# Patient Record
Sex: Female | Born: 1965
Health system: Southern US, Community
[De-identification: ages and names within clinical notes are randomized; demographics above are authoritative.]

## PROBLEM LIST (undated history)

## (undated) DIAGNOSIS — G4730 Sleep apnea, unspecified: Secondary | ICD-10-CM

## (undated) DIAGNOSIS — I10 Essential (primary) hypertension: Secondary | ICD-10-CM

## (undated) DIAGNOSIS — K219 Gastro-esophageal reflux disease without esophagitis: Secondary | ICD-10-CM

## (undated) DIAGNOSIS — E039 Hypothyroidism, unspecified: Secondary | ICD-10-CM

## (undated) DIAGNOSIS — Z9109 Other allergy status, other than to drugs and biological substances: Secondary | ICD-10-CM

## (undated) DIAGNOSIS — T8859XA Other complications of anesthesia, initial encounter: Secondary | ICD-10-CM

## (undated) DIAGNOSIS — Z973 Presence of spectacles and contact lenses: Secondary | ICD-10-CM

## (undated) DIAGNOSIS — R519 Headache, unspecified: Secondary | ICD-10-CM

## (undated) DIAGNOSIS — R51 Headache: Secondary | ICD-10-CM

## (undated) DIAGNOSIS — J189 Pneumonia, unspecified organism: Secondary | ICD-10-CM

## (undated) DIAGNOSIS — T4145XA Adverse effect of unspecified anesthetic, initial encounter: Secondary | ICD-10-CM

## (undated) DIAGNOSIS — M4316 Spondylolisthesis, lumbar region: Secondary | ICD-10-CM

## (undated) DIAGNOSIS — E7800 Pure hypercholesterolemia, unspecified: Secondary | ICD-10-CM

## (undated) DIAGNOSIS — R001 Bradycardia, unspecified: Secondary | ICD-10-CM

## (undated) HISTORY — PX: BILATERAL CARPAL TUNNEL RELEASE: SHX6508

## (undated) HISTORY — PX: EYE SURGERY: SHX253

## (undated) HISTORY — PX: DILATION AND CURETTAGE OF UTERUS: SHX78

## (undated) HISTORY — PX: CERVICAL FUSION: SHX112

## (undated) HISTORY — PX: COLONOSCOPY: SHX174

---

## 2007-01-23 ENCOUNTER — Inpatient Hospital Stay (HOSPITAL_COMMUNITY): Admission: RE | Admit: 2007-01-23 | Discharge: 2007-01-24 | Payer: Self-pay | Admitting: Neurosurgery

## 2007-01-26 ENCOUNTER — Encounter: Admission: RE | Admit: 2007-01-26 | Discharge: 2007-01-26 | Payer: Self-pay | Admitting: Neurosurgery

## 2010-07-31 NOTE — Op Note (Signed)
NAMENOURA, PURPURA NO.:  [id number]   MEDICAL RECORD NO.:  [id number]          PATIENT TYPE:  INP   LOCATION:  2899                         FACILITY:  [facility]   PHYSICIAN:  Danae Orleans. Venetia Maxon, M.D.  DATE OF BIRTH:  [birth year]   DATE OF PROCEDURE:  01/23/2007  DATE OF DISCHARGE:                               OPERATIVE REPORT   PREOPERATIVE DIAGNOSIS:  Herniated cervical disk with spondylosis,  degenerative disk disease and radiculopathy, C5-6.   POSTOPERATIVE DIAGNOSIS:  Herniated cervical disk with spondylosis,  degenerative disk disease and radiculopathy, C5-6.   PROCEDURE:  Anterior cervical decompression and fusion C5-6 and with  allograft bone graft and morcellized bone autograft and anterior  cervical plate.   SURGEON:  Danae Orleans. Venetia Maxon, M.D.   ASSISTANTS:  1. Stefani Dama, M.D.  2. Georgiann Cocker, RN   ANESTHESIA:  General endotracheal anesthesia.   ESTIMATED BLOOD LOSS:  Minimal.   COMPLICATIONS:  None.   DISPOSITION:  To recovery.   INDICATIONS:  Audrey White is a 45 year old morbidly obese woman with  severe right arm pain and neck pain.  She has a large disk herniation  with spondylosis at C5-6 on the right.  It was elected to take her to  surgery for anterior cervical decompression and fusion for a fairly  marked preoperative arm weakness.   PROCEDURE:  Audrey White was brought to the operating room.  Following  satisfactory and uncomplicated induction of general endotracheal  anesthesia and placement of intravenous lines, the patient was placed in  a supine position on the operating table.  Her neck was placed in slight  extension.  She was placed in 5 pounds of Holter traction and her  anterior neck was then prepped and draped in the usual sterile fashion.  The area of plain incision was infiltrated with 0.25% Marcaine, 0.5%  lidocaine and 1:200,000 epinephrine.  The incision was made from the  midline to the anterior border of  sternocleidomastoid muscle on the left  side midline, carried sharply through platysmal layer.  Subplatysmal  dissection was performed exposing the anterior border of the  sternocleidomastoid muscle using blunt dissection.  The carotid sheath  was kept lateral and the trachea and esophagus kept medial, exposing the  anterior cervical spine.  The bent spinal needle was placed and was felt  to be at the C5-6 level, but on intraoperative x-ray it was not possible  to visualize this level.  Consequently, an additional needle was placed  at the C4-5 level and this level was visualized on the second x-ray.  Subsequently, the longus coli muscles were taken down from the anterior  cervical spine using electrocautery and key elevator and a self-  retaining retractor was placed to facilitate exposure.  Large ventral  osteophyte was removed with the Leksell rongeur and this bone graft  along with bone drilling of the end plates were saved for later use with  bone grafting.  Subsequently, distraction pins were placed at C5 and C6  and end plates were stripped of residual disk material and a thorough  diskectomy was performed.  Subsequently, a high-speed drill was used to  decorticate the end plates and uncinate spurs.  Microscope was brought  into the field and under microscopic visualization, the large uncinate  spur and significant foraminal stenosis was decompressing the right side  of the midline.  There were several pieces of free disk material which  were fairly far out into the foramen, which were removed, which resulted  in significant decompression of the C6 nerve root.  Spinal cord dura and  left C6 nerve root were also decompressed.  Hemostasis was assured with  Gelfoam soaked in thrombin.  After trial sizing, a 6 mm bone allograft  wedge was selected, fashioned with a high-speed drill, packed with  morcellized bone autograft and inserted into the interspace, countersunk  appropriately.   Traction weight was removed and a 14-mm Trestle anterior  cervical plate was then affixed to the anterior cervical spine using 14-  mm variable angle screws, two at C5, two at C6.  All screws had  excellent purchase.  Locking mechanisms were engaged.  A final x-ray was  not obtained because it was felt that it would not be possible to  visualize this level.  Soft tissues were inspected and found to be in  good repair.  The platysmal layer was closed with 3-0 Vicryl sutures.  Skin edges were reapproximated with 3-0 Vicryl interrupted, inverted  sutures.  The wound was dressed with Dermabond.  The patient was  extubated in the operating room and taken to the recovery room, having  tolerated the operation well.  Counts were correct at the end of the  case.      Danae Orleans. Venetia Maxon, M.D.  Electronically Signed     JDS/MEDQ  D:  01/23/2007  T:  01/23/2007  Job:  [id number]

## 2010-12-25 LAB — CBC
HCT: 41.8
MCV: 77.8 — ABNORMAL LOW
Platelets: 340
RDW: 15.8 — ABNORMAL HIGH
WBC: 14.8 — ABNORMAL HIGH

## 2010-12-25 LAB — BASIC METABOLIC PANEL
BUN: 9
CO2: 29
Creatinine, Ser: 0.87
Glucose, Bld: 94

## 2011-04-04 DIAGNOSIS — Z23 Encounter for immunization: Secondary | ICD-10-CM | POA: Diagnosis not present

## 2011-04-04 DIAGNOSIS — R0602 Shortness of breath: Secondary | ICD-10-CM | POA: Diagnosis not present

## 2011-04-04 DIAGNOSIS — E039 Hypothyroidism, unspecified: Secondary | ICD-10-CM | POA: Diagnosis not present

## 2011-04-04 DIAGNOSIS — M4800 Spinal stenosis, site unspecified: Secondary | ICD-10-CM | POA: Diagnosis not present

## 2011-04-04 DIAGNOSIS — R42 Dizziness and giddiness: Secondary | ICD-10-CM | POA: Diagnosis not present

## 2011-04-17 DIAGNOSIS — R0602 Shortness of breath: Secondary | ICD-10-CM | POA: Diagnosis not present

## 2011-04-17 DIAGNOSIS — I369 Nonrheumatic tricuspid valve disorder, unspecified: Secondary | ICD-10-CM | POA: Diagnosis not present

## 2011-04-20 DIAGNOSIS — M7710 Lateral epicondylitis, unspecified elbow: Secondary | ICD-10-CM | POA: Diagnosis not present

## 2011-04-20 DIAGNOSIS — G5600 Carpal tunnel syndrome, unspecified upper limb: Secondary | ICD-10-CM | POA: Diagnosis not present

## 2011-05-08 DIAGNOSIS — M4800 Spinal stenosis, site unspecified: Secondary | ICD-10-CM | POA: Diagnosis not present

## 2011-05-08 DIAGNOSIS — R05 Cough: Secondary | ICD-10-CM | POA: Diagnosis not present

## 2011-05-08 DIAGNOSIS — R059 Cough, unspecified: Secondary | ICD-10-CM | POA: Diagnosis not present

## 2011-05-08 DIAGNOSIS — H6120 Impacted cerumen, unspecified ear: Secondary | ICD-10-CM | POA: Diagnosis not present

## 2011-05-11 DIAGNOSIS — R05 Cough: Secondary | ICD-10-CM | POA: Diagnosis not present

## 2011-05-11 DIAGNOSIS — J309 Allergic rhinitis, unspecified: Secondary | ICD-10-CM | POA: Diagnosis not present

## 2011-05-11 DIAGNOSIS — R059 Cough, unspecified: Secondary | ICD-10-CM | POA: Diagnosis not present

## 2011-05-29 DIAGNOSIS — G5600 Carpal tunnel syndrome, unspecified upper limb: Secondary | ICD-10-CM | POA: Diagnosis not present

## 2011-05-29 DIAGNOSIS — M67919 Unspecified disorder of synovium and tendon, unspecified shoulder: Secondary | ICD-10-CM | POA: Diagnosis not present

## 2011-05-29 DIAGNOSIS — M719 Bursopathy, unspecified: Secondary | ICD-10-CM | POA: Diagnosis not present

## 2011-05-29 DIAGNOSIS — M47812 Spondylosis without myelopathy or radiculopathy, cervical region: Secondary | ICD-10-CM | POA: Diagnosis not present

## 2011-05-29 DIAGNOSIS — M542 Cervicalgia: Secondary | ICD-10-CM | POA: Diagnosis not present

## 2011-06-12 DIAGNOSIS — G5600 Carpal tunnel syndrome, unspecified upper limb: Secondary | ICD-10-CM | POA: Diagnosis not present

## 2011-06-12 DIAGNOSIS — M25819 Other specified joint disorders, unspecified shoulder: Secondary | ICD-10-CM | POA: Diagnosis not present

## 2011-06-17 DIAGNOSIS — M6281 Muscle weakness (generalized): Secondary | ICD-10-CM | POA: Diagnosis not present

## 2011-06-17 DIAGNOSIS — M24819 Other specific joint derangements of unspecified shoulder, not elsewhere classified: Secondary | ICD-10-CM | POA: Diagnosis not present

## 2011-06-26 DIAGNOSIS — M6281 Muscle weakness (generalized): Secondary | ICD-10-CM | POA: Diagnosis not present

## 2011-06-26 DIAGNOSIS — M24819 Other specific joint derangements of unspecified shoulder, not elsewhere classified: Secondary | ICD-10-CM | POA: Diagnosis not present

## 2011-06-28 DIAGNOSIS — M24819 Other specific joint derangements of unspecified shoulder, not elsewhere classified: Secondary | ICD-10-CM | POA: Diagnosis not present

## 2011-06-28 DIAGNOSIS — M6281 Muscle weakness (generalized): Secondary | ICD-10-CM | POA: Diagnosis not present

## 2011-07-01 DIAGNOSIS — M24819 Other specific joint derangements of unspecified shoulder, not elsewhere classified: Secondary | ICD-10-CM | POA: Diagnosis not present

## 2011-07-01 DIAGNOSIS — M6281 Muscle weakness (generalized): Secondary | ICD-10-CM | POA: Diagnosis not present

## 2011-07-05 DIAGNOSIS — M24819 Other specific joint derangements of unspecified shoulder, not elsewhere classified: Secondary | ICD-10-CM | POA: Diagnosis not present

## 2011-07-05 DIAGNOSIS — M6281 Muscle weakness (generalized): Secondary | ICD-10-CM | POA: Diagnosis not present

## 2011-07-08 DIAGNOSIS — M24819 Other specific joint derangements of unspecified shoulder, not elsewhere classified: Secondary | ICD-10-CM | POA: Diagnosis not present

## 2011-07-08 DIAGNOSIS — M6281 Muscle weakness (generalized): Secondary | ICD-10-CM | POA: Diagnosis not present

## 2011-07-10 DIAGNOSIS — G5600 Carpal tunnel syndrome, unspecified upper limb: Secondary | ICD-10-CM | POA: Diagnosis not present

## 2011-07-10 DIAGNOSIS — M25819 Other specified joint disorders, unspecified shoulder: Secondary | ICD-10-CM | POA: Diagnosis not present

## 2011-08-20 DIAGNOSIS — E7800 Pure hypercholesterolemia, unspecified: Secondary | ICD-10-CM | POA: Diagnosis not present

## 2011-08-20 DIAGNOSIS — G510 Bell's palsy: Secondary | ICD-10-CM | POA: Diagnosis not present

## 2011-08-20 DIAGNOSIS — R5383 Other fatigue: Secondary | ICD-10-CM | POA: Diagnosis not present

## 2011-08-20 DIAGNOSIS — R4789 Other speech disturbances: Secondary | ICD-10-CM | POA: Diagnosis not present

## 2011-08-20 DIAGNOSIS — R5381 Other malaise: Secondary | ICD-10-CM | POA: Diagnosis not present

## 2011-08-20 DIAGNOSIS — R209 Unspecified disturbances of skin sensation: Secondary | ICD-10-CM | POA: Diagnosis not present

## 2011-08-20 DIAGNOSIS — H538 Other visual disturbances: Secondary | ICD-10-CM | POA: Diagnosis not present

## 2011-08-20 DIAGNOSIS — I10 Essential (primary) hypertension: Secondary | ICD-10-CM | POA: Diagnosis not present

## 2011-09-17 DIAGNOSIS — F331 Major depressive disorder, recurrent, moderate: Secondary | ICD-10-CM | POA: Diagnosis not present

## 2011-09-17 DIAGNOSIS — E782 Mixed hyperlipidemia: Secondary | ICD-10-CM | POA: Diagnosis not present

## 2011-09-17 DIAGNOSIS — E039 Hypothyroidism, unspecified: Secondary | ICD-10-CM | POA: Diagnosis not present

## 2011-09-17 DIAGNOSIS — I10 Essential (primary) hypertension: Secondary | ICD-10-CM | POA: Diagnosis not present

## 2011-10-02 DIAGNOSIS — H04129 Dry eye syndrome of unspecified lacrimal gland: Secondary | ICD-10-CM | POA: Diagnosis not present

## 2011-10-02 DIAGNOSIS — G510 Bell's palsy: Secondary | ICD-10-CM | POA: Diagnosis not present

## 2011-12-14 DIAGNOSIS — L255 Unspecified contact dermatitis due to plants, except food: Secondary | ICD-10-CM | POA: Diagnosis not present

## 2012-01-06 DIAGNOSIS — J329 Chronic sinusitis, unspecified: Secondary | ICD-10-CM | POA: Diagnosis not present

## 2012-01-21 DIAGNOSIS — R42 Dizziness and giddiness: Secondary | ICD-10-CM | POA: Diagnosis not present

## 2012-01-21 DIAGNOSIS — E039 Hypothyroidism, unspecified: Secondary | ICD-10-CM | POA: Diagnosis not present

## 2012-01-21 DIAGNOSIS — Z79899 Other long term (current) drug therapy: Secondary | ICD-10-CM | POA: Diagnosis not present

## 2012-01-23 DIAGNOSIS — M6281 Muscle weakness (generalized): Secondary | ICD-10-CM | POA: Diagnosis not present

## 2012-01-23 DIAGNOSIS — R262 Difficulty in walking, not elsewhere classified: Secondary | ICD-10-CM | POA: Diagnosis not present

## 2012-01-30 DIAGNOSIS — R262 Difficulty in walking, not elsewhere classified: Secondary | ICD-10-CM | POA: Diagnosis not present

## 2012-01-30 DIAGNOSIS — M6281 Muscle weakness (generalized): Secondary | ICD-10-CM | POA: Diagnosis not present

## 2012-02-03 DIAGNOSIS — M6281 Muscle weakness (generalized): Secondary | ICD-10-CM | POA: Diagnosis not present

## 2012-02-03 DIAGNOSIS — R262 Difficulty in walking, not elsewhere classified: Secondary | ICD-10-CM | POA: Diagnosis not present

## 2012-02-07 DIAGNOSIS — M6281 Muscle weakness (generalized): Secondary | ICD-10-CM | POA: Diagnosis not present

## 2012-02-07 DIAGNOSIS — R262 Difficulty in walking, not elsewhere classified: Secondary | ICD-10-CM | POA: Diagnosis not present

## 2012-02-10 DIAGNOSIS — R262 Difficulty in walking, not elsewhere classified: Secondary | ICD-10-CM | POA: Diagnosis not present

## 2012-02-10 DIAGNOSIS — M6281 Muscle weakness (generalized): Secondary | ICD-10-CM | POA: Diagnosis not present

## 2012-02-17 DIAGNOSIS — R262 Difficulty in walking, not elsewhere classified: Secondary | ICD-10-CM | POA: Diagnosis not present

## 2012-02-17 DIAGNOSIS — M6281 Muscle weakness (generalized): Secondary | ICD-10-CM | POA: Diagnosis not present

## 2012-02-19 DIAGNOSIS — R269 Unspecified abnormalities of gait and mobility: Secondary | ICD-10-CM | POA: Diagnosis not present

## 2012-02-19 DIAGNOSIS — E785 Hyperlipidemia, unspecified: Secondary | ICD-10-CM | POA: Diagnosis not present

## 2012-02-19 DIAGNOSIS — E669 Obesity, unspecified: Secondary | ICD-10-CM | POA: Diagnosis not present

## 2012-02-20 DIAGNOSIS — M6281 Muscle weakness (generalized): Secondary | ICD-10-CM | POA: Diagnosis not present

## 2012-02-20 DIAGNOSIS — R262 Difficulty in walking, not elsewhere classified: Secondary | ICD-10-CM | POA: Diagnosis not present

## 2012-02-25 DIAGNOSIS — M6281 Muscle weakness (generalized): Secondary | ICD-10-CM | POA: Diagnosis not present

## 2012-02-25 DIAGNOSIS — R262 Difficulty in walking, not elsewhere classified: Secondary | ICD-10-CM | POA: Diagnosis not present

## 2012-02-28 DIAGNOSIS — R269 Unspecified abnormalities of gait and mobility: Secondary | ICD-10-CM | POA: Diagnosis not present

## 2012-03-03 DIAGNOSIS — R262 Difficulty in walking, not elsewhere classified: Secondary | ICD-10-CM | POA: Diagnosis not present

## 2012-03-03 DIAGNOSIS — M6281 Muscle weakness (generalized): Secondary | ICD-10-CM | POA: Diagnosis not present

## 2012-03-06 DIAGNOSIS — M6281 Muscle weakness (generalized): Secondary | ICD-10-CM | POA: Diagnosis not present

## 2012-03-06 DIAGNOSIS — R262 Difficulty in walking, not elsewhere classified: Secondary | ICD-10-CM | POA: Diagnosis not present

## 2012-03-09 DIAGNOSIS — M6281 Muscle weakness (generalized): Secondary | ICD-10-CM | POA: Diagnosis not present

## 2012-03-09 DIAGNOSIS — R262 Difficulty in walking, not elsewhere classified: Secondary | ICD-10-CM | POA: Diagnosis not present

## 2012-03-16 DIAGNOSIS — M6281 Muscle weakness (generalized): Secondary | ICD-10-CM | POA: Diagnosis not present

## 2012-03-16 DIAGNOSIS — R262 Difficulty in walking, not elsewhere classified: Secondary | ICD-10-CM | POA: Diagnosis not present

## 2012-03-19 DIAGNOSIS — R262 Difficulty in walking, not elsewhere classified: Secondary | ICD-10-CM | POA: Diagnosis not present

## 2012-03-19 DIAGNOSIS — M6281 Muscle weakness (generalized): Secondary | ICD-10-CM | POA: Diagnosis not present

## 2012-04-09 DIAGNOSIS — R269 Unspecified abnormalities of gait and mobility: Secondary | ICD-10-CM | POA: Diagnosis not present

## 2012-04-09 DIAGNOSIS — E785 Hyperlipidemia, unspecified: Secondary | ICD-10-CM | POA: Diagnosis not present

## 2012-04-09 DIAGNOSIS — E669 Obesity, unspecified: Secondary | ICD-10-CM | POA: Diagnosis not present

## 2012-04-23 DIAGNOSIS — D539 Nutritional anemia, unspecified: Secondary | ICD-10-CM | POA: Diagnosis not present

## 2012-04-23 DIAGNOSIS — H6120 Impacted cerumen, unspecified ear: Secondary | ICD-10-CM | POA: Diagnosis not present

## 2012-04-23 DIAGNOSIS — E039 Hypothyroidism, unspecified: Secondary | ICD-10-CM | POA: Diagnosis not present

## 2012-04-23 DIAGNOSIS — Z79899 Other long term (current) drug therapy: Secondary | ICD-10-CM | POA: Diagnosis not present

## 2012-06-01 DIAGNOSIS — D45 Polycythemia vera: Secondary | ICD-10-CM | POA: Diagnosis not present

## 2012-06-01 DIAGNOSIS — D72829 Elevated white blood cell count, unspecified: Secondary | ICD-10-CM | POA: Diagnosis not present

## 2012-06-25 DIAGNOSIS — N809 Endometriosis, unspecified: Secondary | ICD-10-CM | POA: Diagnosis not present

## 2012-07-10 DIAGNOSIS — D72829 Elevated white blood cell count, unspecified: Secondary | ICD-10-CM | POA: Diagnosis not present

## 2012-07-10 DIAGNOSIS — D45 Polycythemia vera: Secondary | ICD-10-CM | POA: Diagnosis not present

## 2012-08-06 DIAGNOSIS — N926 Irregular menstruation, unspecified: Secondary | ICD-10-CM | POA: Diagnosis not present

## 2012-08-17 DIAGNOSIS — N920 Excessive and frequent menstruation with regular cycle: Secondary | ICD-10-CM | POA: Diagnosis not present

## 2012-08-24 DIAGNOSIS — N926 Irregular menstruation, unspecified: Secondary | ICD-10-CM | POA: Diagnosis not present

## 2012-09-08 DIAGNOSIS — E039 Hypothyroidism, unspecified: Secondary | ICD-10-CM | POA: Diagnosis not present

## 2012-09-08 DIAGNOSIS — N809 Endometriosis, unspecified: Secondary | ICD-10-CM | POA: Diagnosis not present

## 2012-09-08 DIAGNOSIS — I10 Essential (primary) hypertension: Secondary | ICD-10-CM | POA: Diagnosis not present

## 2012-09-08 DIAGNOSIS — M542 Cervicalgia: Secondary | ICD-10-CM | POA: Diagnosis not present

## 2012-09-08 DIAGNOSIS — Z006 Encounter for examination for normal comparison and control in clinical research program: Secondary | ICD-10-CM | POA: Diagnosis not present

## 2012-09-17 DIAGNOSIS — N898 Other specified noninflammatory disorders of vagina: Secondary | ICD-10-CM | POA: Diagnosis not present

## 2012-09-17 DIAGNOSIS — N926 Irregular menstruation, unspecified: Secondary | ICD-10-CM | POA: Diagnosis not present

## 2012-09-29 DIAGNOSIS — Z1231 Encounter for screening mammogram for malignant neoplasm of breast: Secondary | ICD-10-CM | POA: Diagnosis not present

## 2012-10-08 DIAGNOSIS — H04129 Dry eye syndrome of unspecified lacrimal gland: Secondary | ICD-10-CM | POA: Diagnosis not present

## 2012-10-14 DIAGNOSIS — N949 Unspecified condition associated with female genital organs and menstrual cycle: Secondary | ICD-10-CM | POA: Diagnosis not present

## 2012-10-14 DIAGNOSIS — I10 Essential (primary) hypertension: Secondary | ICD-10-CM | POA: Diagnosis not present

## 2012-10-14 DIAGNOSIS — N920 Excessive and frequent menstruation with regular cycle: Secondary | ICD-10-CM | POA: Diagnosis not present

## 2012-10-14 DIAGNOSIS — E039 Hypothyroidism, unspecified: Secondary | ICD-10-CM | POA: Diagnosis not present

## 2012-10-14 DIAGNOSIS — N938 Other specified abnormal uterine and vaginal bleeding: Secondary | ICD-10-CM | POA: Diagnosis not present

## 2012-10-14 DIAGNOSIS — N926 Irregular menstruation, unspecified: Secondary | ICD-10-CM | POA: Diagnosis not present

## 2012-10-14 DIAGNOSIS — Z79899 Other long term (current) drug therapy: Secondary | ICD-10-CM | POA: Diagnosis not present

## 2012-10-14 DIAGNOSIS — K219 Gastro-esophageal reflux disease without esophagitis: Secondary | ICD-10-CM | POA: Diagnosis not present

## 2012-11-10 DIAGNOSIS — D72829 Elevated white blood cell count, unspecified: Secondary | ICD-10-CM | POA: Diagnosis not present

## 2012-11-10 DIAGNOSIS — D45 Polycythemia vera: Secondary | ICD-10-CM | POA: Diagnosis not present

## 2012-11-18 DIAGNOSIS — M25539 Pain in unspecified wrist: Secondary | ICD-10-CM | POA: Diagnosis not present

## 2012-11-18 DIAGNOSIS — M25549 Pain in joints of unspecified hand: Secondary | ICD-10-CM | POA: Diagnosis not present

## 2012-11-19 DIAGNOSIS — Z23 Encounter for immunization: Secondary | ICD-10-CM | POA: Diagnosis not present

## 2012-11-30 DIAGNOSIS — M25549 Pain in joints of unspecified hand: Secondary | ICD-10-CM | POA: Diagnosis not present

## 2012-11-30 DIAGNOSIS — M79609 Pain in unspecified limb: Secondary | ICD-10-CM | POA: Diagnosis not present

## 2012-11-30 DIAGNOSIS — M542 Cervicalgia: Secondary | ICD-10-CM | POA: Diagnosis not present

## 2013-01-06 DIAGNOSIS — N926 Irregular menstruation, unspecified: Secondary | ICD-10-CM | POA: Diagnosis not present

## 2013-01-06 DIAGNOSIS — N898 Other specified noninflammatory disorders of vagina: Secondary | ICD-10-CM | POA: Diagnosis not present

## 2013-01-06 DIAGNOSIS — R351 Nocturia: Secondary | ICD-10-CM | POA: Diagnosis not present

## 2013-04-09 DIAGNOSIS — E782 Mixed hyperlipidemia: Secondary | ICD-10-CM | POA: Diagnosis not present

## 2013-04-09 DIAGNOSIS — F331 Major depressive disorder, recurrent, moderate: Secondary | ICD-10-CM | POA: Diagnosis not present

## 2013-04-09 DIAGNOSIS — E559 Vitamin D deficiency, unspecified: Secondary | ICD-10-CM | POA: Diagnosis not present

## 2013-04-09 DIAGNOSIS — E039 Hypothyroidism, unspecified: Secondary | ICD-10-CM | POA: Diagnosis not present

## 2013-04-09 DIAGNOSIS — I10 Essential (primary) hypertension: Secondary | ICD-10-CM | POA: Diagnosis not present

## 2013-05-20 DIAGNOSIS — Z09 Encounter for follow-up examination after completed treatment for conditions other than malignant neoplasm: Secondary | ICD-10-CM | POA: Diagnosis not present

## 2013-05-20 DIAGNOSIS — D45 Polycythemia vera: Secondary | ICD-10-CM | POA: Diagnosis not present

## 2013-05-20 DIAGNOSIS — D72829 Elevated white blood cell count, unspecified: Secondary | ICD-10-CM | POA: Diagnosis not present

## 2013-06-21 DIAGNOSIS — J329 Chronic sinusitis, unspecified: Secondary | ICD-10-CM | POA: Diagnosis not present

## 2013-07-07 DIAGNOSIS — J40 Bronchitis, not specified as acute or chronic: Secondary | ICD-10-CM | POA: Diagnosis not present

## 2013-07-14 DIAGNOSIS — B9689 Other specified bacterial agents as the cause of diseases classified elsewhere: Secondary | ICD-10-CM | POA: Diagnosis not present

## 2013-07-14 DIAGNOSIS — N898 Other specified noninflammatory disorders of vagina: Secondary | ICD-10-CM | POA: Diagnosis not present

## 2013-07-14 DIAGNOSIS — N760 Acute vaginitis: Secondary | ICD-10-CM | POA: Diagnosis not present

## 2013-07-14 DIAGNOSIS — A499 Bacterial infection, unspecified: Secondary | ICD-10-CM | POA: Diagnosis not present

## 2013-07-22 DIAGNOSIS — R05 Cough: Secondary | ICD-10-CM | POA: Diagnosis not present

## 2013-07-22 DIAGNOSIS — R059 Cough, unspecified: Secondary | ICD-10-CM | POA: Diagnosis not present

## 2013-08-06 DIAGNOSIS — R059 Cough, unspecified: Secondary | ICD-10-CM | POA: Diagnosis not present

## 2013-08-06 DIAGNOSIS — R05 Cough: Secondary | ICD-10-CM | POA: Diagnosis not present

## 2013-08-11 DIAGNOSIS — G4730 Sleep apnea, unspecified: Secondary | ICD-10-CM | POA: Diagnosis not present

## 2013-08-11 DIAGNOSIS — R059 Cough, unspecified: Secondary | ICD-10-CM | POA: Diagnosis not present

## 2013-08-11 DIAGNOSIS — E559 Vitamin D deficiency, unspecified: Secondary | ICD-10-CM | POA: Diagnosis not present

## 2013-08-11 DIAGNOSIS — R5383 Other fatigue: Secondary | ICD-10-CM | POA: Diagnosis not present

## 2013-08-11 DIAGNOSIS — G4710 Hypersomnia, unspecified: Secondary | ICD-10-CM | POA: Diagnosis not present

## 2013-08-11 DIAGNOSIS — R5381 Other malaise: Secondary | ICD-10-CM | POA: Diagnosis not present

## 2013-08-11 DIAGNOSIS — R05 Cough: Secondary | ICD-10-CM | POA: Diagnosis not present

## 2013-08-18 DIAGNOSIS — R05 Cough: Secondary | ICD-10-CM | POA: Diagnosis not present

## 2013-08-18 DIAGNOSIS — R5381 Other malaise: Secondary | ICD-10-CM | POA: Diagnosis not present

## 2013-08-18 DIAGNOSIS — R059 Cough, unspecified: Secondary | ICD-10-CM | POA: Diagnosis not present

## 2013-08-18 DIAGNOSIS — J3089 Other allergic rhinitis: Secondary | ICD-10-CM | POA: Diagnosis not present

## 2013-08-18 DIAGNOSIS — R5383 Other fatigue: Secondary | ICD-10-CM | POA: Diagnosis not present

## 2013-08-26 DIAGNOSIS — J3089 Other allergic rhinitis: Secondary | ICD-10-CM | POA: Diagnosis not present

## 2013-09-01 DIAGNOSIS — J3089 Other allergic rhinitis: Secondary | ICD-10-CM | POA: Diagnosis not present

## 2013-09-01 DIAGNOSIS — R059 Cough, unspecified: Secondary | ICD-10-CM | POA: Diagnosis not present

## 2013-09-01 DIAGNOSIS — R5383 Other fatigue: Secondary | ICD-10-CM | POA: Diagnosis not present

## 2013-09-01 DIAGNOSIS — R05 Cough: Secondary | ICD-10-CM | POA: Diagnosis not present

## 2013-09-01 DIAGNOSIS — R5381 Other malaise: Secondary | ICD-10-CM | POA: Diagnosis not present

## 2013-09-30 DIAGNOSIS — Z1231 Encounter for screening mammogram for malignant neoplasm of breast: Secondary | ICD-10-CM | POA: Diagnosis not present

## 2013-10-14 DIAGNOSIS — H04129 Dry eye syndrome of unspecified lacrimal gland: Secondary | ICD-10-CM | POA: Diagnosis not present

## 2013-10-14 DIAGNOSIS — H1045 Other chronic allergic conjunctivitis: Secondary | ICD-10-CM | POA: Diagnosis not present

## 2013-11-17 DIAGNOSIS — Z1211 Encounter for screening for malignant neoplasm of colon: Secondary | ICD-10-CM | POA: Diagnosis not present

## 2013-11-17 DIAGNOSIS — K5730 Diverticulosis of large intestine without perforation or abscess without bleeding: Secondary | ICD-10-CM | POA: Diagnosis not present

## 2013-12-02 DIAGNOSIS — E559 Vitamin D deficiency, unspecified: Secondary | ICD-10-CM | POA: Diagnosis not present

## 2013-12-02 DIAGNOSIS — R5381 Other malaise: Secondary | ICD-10-CM | POA: Diagnosis not present

## 2013-12-02 DIAGNOSIS — J3089 Other allergic rhinitis: Secondary | ICD-10-CM | POA: Diagnosis not present

## 2013-12-02 DIAGNOSIS — R05 Cough: Secondary | ICD-10-CM | POA: Diagnosis not present

## 2013-12-02 DIAGNOSIS — G4730 Sleep apnea, unspecified: Secondary | ICD-10-CM | POA: Diagnosis not present

## 2013-12-02 DIAGNOSIS — G4710 Hypersomnia, unspecified: Secondary | ICD-10-CM | POA: Diagnosis not present

## 2013-12-02 DIAGNOSIS — R059 Cough, unspecified: Secondary | ICD-10-CM | POA: Diagnosis not present

## 2013-12-09 DIAGNOSIS — G4730 Sleep apnea, unspecified: Secondary | ICD-10-CM | POA: Diagnosis not present

## 2013-12-09 DIAGNOSIS — G4710 Hypersomnia, unspecified: Secondary | ICD-10-CM | POA: Diagnosis not present

## 2013-12-15 DIAGNOSIS — J3089 Other allergic rhinitis: Secondary | ICD-10-CM | POA: Diagnosis not present

## 2013-12-15 DIAGNOSIS — R05 Cough: Secondary | ICD-10-CM | POA: Diagnosis not present

## 2013-12-15 DIAGNOSIS — Z23 Encounter for immunization: Secondary | ICD-10-CM | POA: Diagnosis not present

## 2013-12-15 DIAGNOSIS — R059 Cough, unspecified: Secondary | ICD-10-CM | POA: Diagnosis not present

## 2013-12-15 DIAGNOSIS — G4730 Sleep apnea, unspecified: Secondary | ICD-10-CM | POA: Diagnosis not present

## 2013-12-15 DIAGNOSIS — R5383 Other fatigue: Secondary | ICD-10-CM | POA: Diagnosis not present

## 2013-12-15 DIAGNOSIS — G4710 Hypersomnia, unspecified: Secondary | ICD-10-CM | POA: Diagnosis not present

## 2013-12-15 DIAGNOSIS — R5381 Other malaise: Secondary | ICD-10-CM | POA: Diagnosis not present

## 2014-01-03 DIAGNOSIS — G4733 Obstructive sleep apnea (adult) (pediatric): Secondary | ICD-10-CM | POA: Diagnosis not present

## 2014-01-10 DIAGNOSIS — R5383 Other fatigue: Secondary | ICD-10-CM | POA: Diagnosis not present

## 2014-01-10 DIAGNOSIS — G4733 Obstructive sleep apnea (adult) (pediatric): Secondary | ICD-10-CM | POA: Diagnosis not present

## 2014-01-10 DIAGNOSIS — J309 Allergic rhinitis, unspecified: Secondary | ICD-10-CM | POA: Diagnosis not present

## 2014-01-17 DIAGNOSIS — G4733 Obstructive sleep apnea (adult) (pediatric): Secondary | ICD-10-CM | POA: Diagnosis not present

## 2014-01-24 DIAGNOSIS — M79671 Pain in right foot: Secondary | ICD-10-CM | POA: Diagnosis not present

## 2014-01-24 DIAGNOSIS — E039 Hypothyroidism, unspecified: Secondary | ICD-10-CM | POA: Diagnosis not present

## 2014-03-03 DIAGNOSIS — G4733 Obstructive sleep apnea (adult) (pediatric): Secondary | ICD-10-CM | POA: Diagnosis not present

## 2014-03-03 DIAGNOSIS — R5383 Other fatigue: Secondary | ICD-10-CM | POA: Diagnosis not present

## 2014-03-03 DIAGNOSIS — J309 Allergic rhinitis, unspecified: Secondary | ICD-10-CM | POA: Diagnosis not present

## 2014-05-10 DIAGNOSIS — M25532 Pain in left wrist: Secondary | ICD-10-CM | POA: Diagnosis not present

## 2014-06-21 DIAGNOSIS — G4733 Obstructive sleep apnea (adult) (pediatric): Secondary | ICD-10-CM | POA: Diagnosis not present

## 2014-06-23 DIAGNOSIS — E782 Mixed hyperlipidemia: Secondary | ICD-10-CM | POA: Diagnosis not present

## 2014-06-23 DIAGNOSIS — E039 Hypothyroidism, unspecified: Secondary | ICD-10-CM | POA: Diagnosis not present

## 2014-06-23 DIAGNOSIS — I10 Essential (primary) hypertension: Secondary | ICD-10-CM | POA: Diagnosis not present

## 2014-06-23 DIAGNOSIS — K219 Gastro-esophageal reflux disease without esophagitis: Secondary | ICD-10-CM | POA: Diagnosis not present

## 2014-06-23 DIAGNOSIS — Z79899 Other long term (current) drug therapy: Secondary | ICD-10-CM | POA: Diagnosis not present

## 2014-07-12 DIAGNOSIS — Z124 Encounter for screening for malignant neoplasm of cervix: Secondary | ICD-10-CM | POA: Diagnosis not present

## 2014-07-12 DIAGNOSIS — B372 Candidiasis of skin and nail: Secondary | ICD-10-CM | POA: Diagnosis not present

## 2014-07-12 DIAGNOSIS — N898 Other specified noninflammatory disorders of vagina: Secondary | ICD-10-CM | POA: Diagnosis not present

## 2014-07-14 DIAGNOSIS — R079 Chest pain, unspecified: Secondary | ICD-10-CM | POA: Diagnosis not present

## 2014-07-14 DIAGNOSIS — I10 Essential (primary) hypertension: Secondary | ICD-10-CM | POA: Diagnosis not present

## 2014-07-15 DIAGNOSIS — R079 Chest pain, unspecified: Secondary | ICD-10-CM | POA: Diagnosis not present

## 2014-07-20 DIAGNOSIS — Z79899 Other long term (current) drug therapy: Secondary | ICD-10-CM | POA: Diagnosis not present

## 2014-07-20 DIAGNOSIS — I10 Essential (primary) hypertension: Secondary | ICD-10-CM | POA: Diagnosis not present

## 2014-07-20 DIAGNOSIS — Z0000 Encounter for general adult medical examination without abnormal findings: Secondary | ICD-10-CM | POA: Diagnosis not present

## 2014-07-20 DIAGNOSIS — E039 Hypothyroidism, unspecified: Secondary | ICD-10-CM | POA: Diagnosis not present

## 2014-07-30 DIAGNOSIS — J811 Chronic pulmonary edema: Secondary | ICD-10-CM | POA: Diagnosis not present

## 2014-07-30 DIAGNOSIS — E780 Pure hypercholesterolemia: Secondary | ICD-10-CM | POA: Diagnosis not present

## 2014-07-30 DIAGNOSIS — R002 Palpitations: Secondary | ICD-10-CM | POA: Diagnosis not present

## 2014-07-30 DIAGNOSIS — R531 Weakness: Secondary | ICD-10-CM | POA: Diagnosis not present

## 2014-07-30 DIAGNOSIS — I10 Essential (primary) hypertension: Secondary | ICD-10-CM | POA: Diagnosis not present

## 2014-07-30 DIAGNOSIS — K5900 Constipation, unspecified: Secondary | ICD-10-CM | POA: Diagnosis not present

## 2014-07-30 DIAGNOSIS — I517 Cardiomegaly: Secondary | ICD-10-CM | POA: Diagnosis not present

## 2014-07-30 DIAGNOSIS — M1990 Unspecified osteoarthritis, unspecified site: Secondary | ICD-10-CM | POA: Diagnosis not present

## 2014-07-30 DIAGNOSIS — E039 Hypothyroidism, unspecified: Secondary | ICD-10-CM | POA: Diagnosis not present

## 2014-07-30 DIAGNOSIS — R200 Anesthesia of skin: Secondary | ICD-10-CM | POA: Diagnosis not present

## 2014-08-04 DIAGNOSIS — R002 Palpitations: Secondary | ICD-10-CM | POA: Diagnosis not present

## 2014-08-04 DIAGNOSIS — R0602 Shortness of breath: Secondary | ICD-10-CM | POA: Diagnosis not present

## 2014-08-04 DIAGNOSIS — I10 Essential (primary) hypertension: Secondary | ICD-10-CM | POA: Diagnosis not present

## 2014-08-18 DIAGNOSIS — I517 Cardiomegaly: Secondary | ICD-10-CM | POA: Diagnosis not present

## 2014-08-18 DIAGNOSIS — I361 Nonrheumatic tricuspid (valve) insufficiency: Secondary | ICD-10-CM | POA: Diagnosis not present

## 2014-08-18 DIAGNOSIS — R002 Palpitations: Secondary | ICD-10-CM | POA: Diagnosis not present

## 2014-08-22 DIAGNOSIS — H04129 Dry eye syndrome of unspecified lacrimal gland: Secondary | ICD-10-CM | POA: Diagnosis not present

## 2014-08-26 DIAGNOSIS — E039 Hypothyroidism, unspecified: Secondary | ICD-10-CM | POA: Diagnosis not present

## 2014-09-19 DIAGNOSIS — I10 Essential (primary) hypertension: Secondary | ICD-10-CM | POA: Insufficient documentation

## 2014-09-19 DIAGNOSIS — R0681 Apnea, not elsewhere classified: Secondary | ICD-10-CM | POA: Insufficient documentation

## 2014-09-19 DIAGNOSIS — R002 Palpitations: Secondary | ICD-10-CM | POA: Insufficient documentation

## 2014-09-21 DIAGNOSIS — R002 Palpitations: Secondary | ICD-10-CM | POA: Diagnosis not present

## 2014-09-21 DIAGNOSIS — I10 Essential (primary) hypertension: Secondary | ICD-10-CM | POA: Diagnosis not present

## 2014-09-29 DIAGNOSIS — R1011 Right upper quadrant pain: Secondary | ICD-10-CM | POA: Diagnosis not present

## 2014-09-29 DIAGNOSIS — R1031 Right lower quadrant pain: Secondary | ICD-10-CM | POA: Diagnosis not present

## 2014-10-03 DIAGNOSIS — Z1231 Encounter for screening mammogram for malignant neoplasm of breast: Secondary | ICD-10-CM | POA: Diagnosis not present

## 2014-10-07 DIAGNOSIS — N888 Other specified noninflammatory disorders of cervix uteri: Secondary | ICD-10-CM | POA: Diagnosis not present

## 2014-10-07 DIAGNOSIS — R161 Splenomegaly, not elsewhere classified: Secondary | ICD-10-CM | POA: Diagnosis not present

## 2014-10-07 DIAGNOSIS — R1031 Right lower quadrant pain: Secondary | ICD-10-CM | POA: Diagnosis not present

## 2014-10-07 DIAGNOSIS — R1011 Right upper quadrant pain: Secondary | ICD-10-CM | POA: Diagnosis not present

## 2014-10-07 DIAGNOSIS — K760 Fatty (change of) liver, not elsewhere classified: Secondary | ICD-10-CM | POA: Diagnosis not present

## 2014-10-27 DIAGNOSIS — M545 Low back pain: Secondary | ICD-10-CM | POA: Diagnosis not present

## 2014-10-27 DIAGNOSIS — M5136 Other intervertebral disc degeneration, lumbar region: Secondary | ICD-10-CM | POA: Diagnosis not present

## 2014-10-27 DIAGNOSIS — M4317 Spondylolisthesis, lumbosacral region: Secondary | ICD-10-CM | POA: Diagnosis not present

## 2014-10-27 DIAGNOSIS — R209 Unspecified disturbances of skin sensation: Secondary | ICD-10-CM | POA: Diagnosis not present

## 2014-10-27 DIAGNOSIS — S3992XA Unspecified injury of lower back, initial encounter: Secondary | ICD-10-CM | POA: Diagnosis not present

## 2014-10-31 DIAGNOSIS — M5136 Other intervertebral disc degeneration, lumbar region: Secondary | ICD-10-CM | POA: Diagnosis not present

## 2014-11-07 DIAGNOSIS — E039 Hypothyroidism, unspecified: Secondary | ICD-10-CM | POA: Diagnosis not present

## 2014-11-07 DIAGNOSIS — R001 Bradycardia, unspecified: Secondary | ICD-10-CM | POA: Diagnosis not present

## 2014-12-13 DIAGNOSIS — Z23 Encounter for immunization: Secondary | ICD-10-CM | POA: Diagnosis not present

## 2014-12-13 DIAGNOSIS — M5136 Other intervertebral disc degeneration, lumbar region: Secondary | ICD-10-CM | POA: Diagnosis not present

## 2015-01-03 DIAGNOSIS — E162 Hypoglycemia, unspecified: Secondary | ICD-10-CM | POA: Diagnosis not present

## 2015-01-11 DIAGNOSIS — M545 Low back pain: Secondary | ICD-10-CM | POA: Diagnosis not present

## 2015-01-11 DIAGNOSIS — R296 Repeated falls: Secondary | ICD-10-CM | POA: Diagnosis not present

## 2015-01-11 DIAGNOSIS — M5416 Radiculopathy, lumbar region: Secondary | ICD-10-CM | POA: Diagnosis not present

## 2015-01-18 ENCOUNTER — Other Ambulatory Visit: Payer: Self-pay | Admitting: Neurosurgery

## 2015-01-18 DIAGNOSIS — M5416 Radiculopathy, lumbar region: Secondary | ICD-10-CM

## 2015-01-30 ENCOUNTER — Inpatient Hospital Stay: Admission: RE | Admit: 2015-01-30 | Payer: Self-pay | Source: Ambulatory Visit

## 2015-01-30 ENCOUNTER — Ambulatory Visit
Admission: RE | Admit: 2015-01-30 | Discharge: 2015-01-30 | Disposition: A | Discharge status: Home / Self Care | Payer: Medicare Other | Source: Ambulatory Visit | Attending: Neurosurgery | Admitting: Neurosurgery

## 2015-01-30 DIAGNOSIS — M5416 Radiculopathy, lumbar region: Secondary | ICD-10-CM

## 2015-01-30 DIAGNOSIS — M5126 Other intervertebral disc displacement, lumbar region: Secondary | ICD-10-CM | POA: Diagnosis not present

## 2015-03-06 DIAGNOSIS — M5416 Radiculopathy, lumbar region: Secondary | ICD-10-CM | POA: Diagnosis not present

## 2015-03-06 DIAGNOSIS — M545 Low back pain: Secondary | ICD-10-CM | POA: Diagnosis not present

## 2015-03-06 DIAGNOSIS — M4120 Other idiopathic scoliosis, site unspecified: Secondary | ICD-10-CM | POA: Diagnosis not present

## 2015-03-06 DIAGNOSIS — M4306 Spondylolysis, lumbar region: Secondary | ICD-10-CM | POA: Diagnosis not present

## 2015-03-06 DIAGNOSIS — M4317 Spondylolisthesis, lumbosacral region: Secondary | ICD-10-CM | POA: Diagnosis not present

## 2015-03-06 DIAGNOSIS — M4807 Spinal stenosis, lumbosacral region: Secondary | ICD-10-CM | POA: Diagnosis not present

## 2015-03-07 ENCOUNTER — Other Ambulatory Visit: Payer: Self-pay | Admitting: Neurosurgery

## 2015-03-23 DIAGNOSIS — I10 Essential (primary) hypertension: Secondary | ICD-10-CM | POA: Diagnosis not present

## 2015-03-23 DIAGNOSIS — K5909 Other constipation: Secondary | ICD-10-CM | POA: Diagnosis not present

## 2015-03-23 DIAGNOSIS — E876 Hypokalemia: Secondary | ICD-10-CM | POA: Diagnosis not present

## 2015-04-19 ENCOUNTER — Encounter (HOSPITAL_COMMUNITY): Payer: Self-pay

## 2015-04-19 ENCOUNTER — Encounter (HOSPITAL_COMMUNITY)
Admission: RE | Admit: 2015-04-19 | Discharge: 2015-04-19 | Disposition: A | Discharge status: Home / Self Care | Payer: Medicare Other | Source: Ambulatory Visit | Attending: Neurosurgery | Admitting: Neurosurgery

## 2015-04-19 DIAGNOSIS — G4733 Obstructive sleep apnea (adult) (pediatric): Secondary | ICD-10-CM | POA: Diagnosis not present

## 2015-04-19 DIAGNOSIS — I10 Essential (primary) hypertension: Secondary | ICD-10-CM | POA: Insufficient documentation

## 2015-04-19 DIAGNOSIS — R001 Bradycardia, unspecified: Secondary | ICD-10-CM | POA: Diagnosis not present

## 2015-04-19 DIAGNOSIS — Z01818 Encounter for other preprocedural examination: Secondary | ICD-10-CM | POA: Diagnosis not present

## 2015-04-19 DIAGNOSIS — Z79899 Other long term (current) drug therapy: Secondary | ICD-10-CM | POA: Diagnosis not present

## 2015-04-19 DIAGNOSIS — E785 Hyperlipidemia, unspecified: Secondary | ICD-10-CM | POA: Insufficient documentation

## 2015-04-19 DIAGNOSIS — E039 Hypothyroidism, unspecified: Secondary | ICD-10-CM | POA: Insufficient documentation

## 2015-04-19 DIAGNOSIS — M4317 Spondylolisthesis, lumbosacral region: Secondary | ICD-10-CM | POA: Diagnosis not present

## 2015-04-19 DIAGNOSIS — Z0183 Encounter for blood typing: Secondary | ICD-10-CM | POA: Diagnosis not present

## 2015-04-19 DIAGNOSIS — Z01812 Encounter for preprocedural laboratory examination: Secondary | ICD-10-CM | POA: Diagnosis not present

## 2015-04-19 HISTORY — DX: Adverse effect of unspecified anesthetic, initial encounter: T41.45XA

## 2015-04-19 HISTORY — DX: Spondylolisthesis, lumbar region: M43.16

## 2015-04-19 HISTORY — DX: Presence of spectacles and contact lenses: Z97.3

## 2015-04-19 HISTORY — DX: Headache, unspecified: R51.9

## 2015-04-19 HISTORY — DX: Gastro-esophageal reflux disease without esophagitis: K21.9

## 2015-04-19 HISTORY — DX: Bradycardia, unspecified: R00.1

## 2015-04-19 HISTORY — DX: Headache: R51

## 2015-04-19 HISTORY — DX: Other allergy status, other than to drugs and biological substances: Z91.09

## 2015-04-19 HISTORY — DX: Other complications of anesthesia, initial encounter: T88.59XA

## 2015-04-19 HISTORY — DX: Essential (primary) hypertension: I10

## 2015-04-19 HISTORY — DX: Sleep apnea, unspecified: G47.30

## 2015-04-19 HISTORY — DX: Hypothyroidism, unspecified: E03.9

## 2015-04-19 HISTORY — DX: Pure hypercholesterolemia, unspecified: E78.00

## 2015-04-19 HISTORY — DX: Pneumonia, unspecified organism: J18.9

## 2015-04-19 LAB — BASIC METABOLIC PANEL
ANION GAP: 8 (ref 5–15)
BUN: 11 mg/dL (ref 6–20)
CALCIUM: 10.6 mg/dL — AB (ref 8.9–10.3)
CO2: 26 mmol/L (ref 22–32)
Chloride: 104 mmol/L (ref 101–111)
Creatinine, Ser: 1.07 mg/dL — ABNORMAL HIGH (ref 0.44–1.00)
GFR, EST NON AFRICAN AMERICAN: 59 mL/min — AB (ref >60)
Glucose, Bld: 86 mg/dL (ref 65–99)
Potassium: 4.4 mmol/L (ref 3.5–5.1)
Sodium: 138 mmol/L (ref 135–145)

## 2015-04-19 LAB — CBC
HCT: 47.2 % — ABNORMAL HIGH (ref 36.0–46.0)
HEMOGLOBIN: 16.3 g/dL — AB (ref 12.0–15.0)
MCH: 29.1 pg (ref 26.0–34.0)
MCHC: 34.5 g/dL (ref 30.0–36.0)
MCV: 84.3 fL (ref 78.0–100.0)
Platelets: 236 10*3/uL (ref 150–400)
RBC: 5.6 MIL/uL — AB (ref 3.87–5.11)
RDW: 14.3 % (ref 11.5–15.5)
WBC: 12.5 10*3/uL — ABNORMAL HIGH (ref 4.0–10.5)

## 2015-04-19 LAB — ABO/RH: ABO/RH(D): O NEG

## 2015-04-19 LAB — TYPE AND SCREEN
ABO/RH(D): O NEG
ANTIBODY SCREEN: NEGATIVE

## 2015-04-19 LAB — SURGICAL PCR SCREEN
MRSA, PCR: NEGATIVE
STAPHYLOCOCCUS AUREUS: POSITIVE — AB

## 2015-04-19 NOTE — Progress Notes (Signed)
Mupirocin Ointment Rx called into [facility] in [place] for positive PCR of Staph by Joanie Coddington, RN. Pt notified of results and voiced understanding.

## 2015-04-19 NOTE — Pre-Procedure Instructions (Signed)
Audrey White  04/19/2015      [id number] - [place], [place] AT [address] [place] [address] Phone: [phone] Fax: [phone]    Your procedure is scheduled on Thursday, April 27, 2015  Report to [facility] Admitting at 5:30 A.M.  Call this number if you have problems the morning of surgery:  [phone]   Remember:  Do not eat food or drink liquids after midnight Wednesday, April 26, 2015  Take these medicines the morning of surgery with A SIP OF WATER : metoprolol tartrate (LOPRESSOR), ranitidine (ZANTAC), SYNTHROID, montelukast (SINGULAIR)   Stop taking Aspirin, vitamins., fish oil and herbal medications. Do not take any NSAIDs ie: Ibuprofen, Advil, Naproxen, BC's and Goody Powder or any medication containing Aspirin; stop Friday  April 21, 2015.  Do not wear jewelry, make-up or nail polish.  Do not wear lotions, powders, or perfumes.  You may not wear deodorant.  Do not shave 48 hours prior to surgery.    Do not bring valuables to the hospital.  [facility] is not responsible for any belongings or valuables.  Contacts, dentures or bridgework may not be worn into surgery.  Leave your suitcase in the car.  After surgery it may be brought to your room.  For patients admitted to the hospital, discharge time will be determined by your treatment team.  Patients discharged the day of surgery will not be allowed to drive home.   Name and phone number of your driver:   Special instructions:  Special Instructions:Special Instructions: [facility] - Preparing for Surgery  Before surgery, you can play an important role.  Because skin is not sterile, your skin needs to be as free of germs as possible.  You can reduce the number of germs on you skin by washing with CHG (chlorahexidine gluconate) soap before surgery.  CHG is an antiseptic cleaner which kills germs and bonds  with the skin to continue killing germs even after washing.  Please DO NOT use if you have an allergy to CHG or antibacterial soaps.  If your skin becomes reddened/irritated stop using the CHG and inform your nurse when you arrive at Short Stay.  Do not shave (including legs and underarms) for at least 48 hours prior to the first CHG shower.  You may shave your face.  Please follow these instructions carefully:   1.  Shower with CHG Soap the night before surgery and the morning of Surgery.  2.  If you choose to wash your hair, wash your hair first as usual with your normal shampoo.  3.  After you shampoo, rinse your hair and body thoroughly to remove the Shampoo.  4.  Use CHG as you would any other liquid soap.  You can apply chg directly  to the skin and wash gently with scrungie or a clean washcloth.  5.  Apply the CHG Soap to your body ONLY FROM THE NECK DOWN.  Do not use on open wounds or open sores.  Avoid contact with your eyes, ears, mouth and genitals (private parts).  Wash genitals (private parts) with your normal soap.  6.  Wash thoroughly, paying special attention to the area where your surgery will be performed.  7.  Thoroughly rinse your body with warm water from the neck down.  8.  DO NOT shower/wash with your normal soap after using and rinsing off the CHG  Soap.  9.  Pat yourself dry with a clean towel.            10.  Wear clean pajamas.            11.  Place clean sheets on your bed the night of your first shower and do not sleep with pets.  Day of Surgery  Do not apply any lotions/deodorants the morning of surgery.  Please wear clean clothes to the hospital/surgery center.  Please read over the following fact sheets that you were given. Pain Booklet, Coughing and Deep Breathing, Blood Transfusion Information, MRSA Information and Surgical Site Infection Prevention

## 2015-04-19 NOTE — Progress Notes (Signed)
Pt denies SOB and chest pain but stated that she was under the care of Dr. Bettina Gavia, Cardiology. Pt denies having a stress test and cardiac cath. Pt initial pulse upon arrival to PAT appointment was 48 then 55 when repeated. Pt stated, " The doctor said ( Dr. Gara Kroner. Helene Kelp of [facility] in [place] ) that I have a low heartbeat."  Pt stated that an EKG was recently done at [facility]; cardiac records requested from [facility], Dr. Bettina Gavia and Dr. Gara Kroner. Helene Kelp at [facility]. Pt chart forwarded to anesthesia to review cardiac history.

## 2015-04-20 NOTE — Progress Notes (Addendum)
Anesthesia Chart Review:  Pt is a 50 year old female scheduled for L5 gill procedure, L5-S1 PLIF on 04/27/2015 with Dr. Vertell Limber.   PMH includes:  Bradycardia, HTN, hypothyroidism, hyperlipidemia, OSA (no CPAP). Never smoker. BMI 43  HR 48 at PAT, BP 128/78  Anesthesia history: pt reports " I am hard to wake up"  Medications include: losartan, metoprolol, potassium, pravastatin, zantac, spironolactone, synthroid.   Preoperative labs reviewed.    EKG 07/30/14: marked sinus bradycardia (48 bpm). Minimal voltage criteria for LVH, may be normal variant.   Holter monitor 08/24/14:  -NSR with bradycardia (as low as 38 bpm) - Maximum HR 98 - Avg HR 60 - 1 pause lasting 2.12 seconds - isolated PACs  Echo 08/18/14:  1. Concentric LVH 2. EF 55-60%. Doppler evidence ov grade II (pseudonormal) diastolic dysfunction 3. LA is severely dilated 4. Trace MR 5. Mild TR  Saw cardiology (Dr. Bettina Gavia, care everywhere) for palpitations 09/2014. Holter and echo "unremarkable". No further cardiac evaluation recommended.   Given bradycardia, pt should hold metoprolol DOS. Spoke with pt 04/21/15 and notified her to hold metoprolol DOS. Pt was able to repeat instruction back to me  Willeen Cass, FNP-BC [facility]/Anesthesiology Phone: [phone] 04/21/2015 12:33 PM

## 2015-04-21 NOTE — H&P (Signed)
Patient ID:   [id number] Patient: Audrey White  Date of Birth: [birth year] Visit Type: Office Visit   Date: 03/06/2015 10:00 AM Provider: Marchia Meiers. Vertell Limber MD   This 50 year old female presents for Back pain.  History of Present Illness: 1.  Back pain  The patient's imaging studies.  She has L5-S1 spondylolisthesis with L5 spondylolysis.  She has more global instability on flexion and extension views with L5-S1 anterolisthesis of 14 mm on flexion, 13.7 mm on extension and 48 mm on the lateral radiograph.  The patient has 4 out of 5 left EHL strength.  She is continuing to complain of significant left leg pain.  We talked about the importance of weight control.  I have recommended to the patient that she undergo decompression and fusion surgery given the severity of her imaging findings and significant weakness and pain.  This will consist of L5 Gill procedure with L5-S1 fusion.  We fitted her for an LSO brace.  Surgery is tentatively scheduled for the second week of February.      Medical/Surgical/Interim History Reviewed, no change.  Last detailed document date:11/30/2012.   PAST MEDICAL HISTORY, SURGICAL HISTORY, FAMILY HISTORY, SOCIAL HISTORY AND REVIEW OF SYSTEMS I have reviewed the patient's past medical, surgical, family and social history as well as the comprehensive review of systems as included on the [facility] history form dated 01/11/2015, which I have signed.  Family History: Reviewed, no changes.  Last detailed document: 11/30/2012.   Social History: Tobacco use reviewed. Reviewed, no changes. Last detailed document date: 11/30/2012.      MEDICATIONS(added, continued or stopped this visit): Started Medication Directions Instruction Stopped   levothyroxine 200 mcg tablet take 1 tablet by oral route  every day     levothyroxine 50 mcg tablet take 1 tablet by oral route  every day     PRAVASTATIN SODIUM take 1 tablet by oral route   every day     ranitidine HCl      sertraline 100 mg tablet take 1 tablet by oral route  every day     spironolactone take 1 tablet by oral route  every day       ALLERGIES: Ingredient Reaction Medication Name Comment  NO KNOWN ALLERGIES     No known allergies. Reviewed, no changes.    Vitals Date Temp F BP Pulse Ht In Wt Lb BMI BSA Pain Score  03/06/2015  138/83 53 66 270 43.58  10/10      IMPRESSION L5 spondylolysis with L5-S1 spondylolisthesis with left leg weakness.  Grade 2 spondylolisthesis  Assessment/Plan # Detail Type Description   1. Assessment Low back pain, unspecified back pain laterality, with sciatica presence unspecified (M54.5).       2. Assessment Radiculopathy, lumbar region (M54.16).       3. Assessment Scoliosis (and kyphoscoliosis), idiopathic (M41.20).       4. Assessment Spinal stenosis of lumbosacral region (M48.07).       5. Assessment Spondylolisthesis, lumbosacral region (M43.17).       6. Assessment Spondylolysis of lumbar region (M43.06).       7. Assessment Body mass index (BMI) 40.0-44.9, adult (Z68.41).   Plan Orders Today's instructions / counseling include(s) Lifestyle education regarding diet.         Pain Assessment/Treatment Pain Scale: 10/10. Method: Numeric Pain Intensity Scale. Location: back. Onset: 03/05/2014. Duration: varies. Quality: discomforting. Pain Assessment/Treatment follow-up plan of care: Patient is taking medications as prescribed.Arville Go  with L5-S1 decompression and fusion.  Risks and Benefits are discussed in detail with patient and she wishes to proceed.  Orders: Diagnostic Procedures: Assessment Procedure  M43.17 L 5 Gill, PLIF - L5-S1 (not MAS)  Instruction(s)/Education: Assessment Instruction  Z68.41 Lifestyle education regarding diet             Provider:  Marchia Meiers. Vertell Limber MD  03/10/2015 02:05 PM Dictation edited by: Marchia Meiers. Vertell Limber    CC Providers: Kennith Maes [facility] [address] [place], [place] [address]-              Electronically signed by Marchia Meiers Vertell Limber MD on 03/10/2015 02:05 PM  Patient ID:   [id number] Patient: Audrey White  Date of Birth: [birth year] Visit Type: Chart Update   Date: 01/11/2015 03:00 PM Provider: Marchia Meiers. Vertell Limber MD   This 50 year old female presents for neck pain.  History of Present Illness: 1.  neck pain  I have not seen the patient since September 2014.  She had previous anterior cervical decompression and fusion surgery at the C5 C6 level.  In July 2016 she began to complain of left leg pain and she fell.  On examination today she has a positive straight leg raise on the left.  She has 5 out of 5 strength in her lower extremities.  She has left sciatic notch discomfort.  She is walking with a limp favoring her left leg.      Medical/Surgical/Interim History Reviewed, no change.  Last detailed document date:11/30/2012.   PAST MEDICAL HISTORY, SURGICAL HISTORY, FAMILY HISTORY, SOCIAL HISTORY AND REVIEW OF SYSTEMS I have reviewed the patient's past medical, surgical, family and social history as well as the comprehensive review of systems as included on the [facility] history form dated 01/11/2015, which I have signed.  Family History: Reviewed, no changes.  Last detailed document: 11/30/2012.   Social History: Tobacco use reviewed. Reviewed, no changes. Last detailed document date: 11/30/2012.      MEDICATIONS(added, continued or stopped this visit): Started Medication Directions Instruction Stopped   levothyroxine 200 mcg tablet take 1 tablet by oral route  every day     levothyroxine 50 mcg tablet take 1 tablet by oral route  every day     PRAVASTATIN SODIUM take 1 tablet by oral route  every day     ranitidine HCl      sertraline 100 mg tablet take 1 tablet by oral route  every day     spironolactone take 1 tablet by oral route  every day        ALLERGIES: Ingredient Reaction Medication Name Comment  NO KNOWN ALLERGIES     No known allergies.    Vitals Date Temp F BP Pulse Ht In Wt Lb BMI BSA Pain Score  01/11/2015  114/75 50 66 258.6 41.74  10/10      IMPRESSION In light of what appears to be a significant radiculopathy, I recommend we obtain lumbar radiographs and lumbar MRI.  The patient has never had prior lumbar surgery.  Completed Orders (this encounter) Order Details Reason Side Interpretation Result Initial Treatment Date Region  Lumbar Spine- AP/Lat/Flex/Ex      01/11/2015   Lifestyle education regarding diet Patient encouraged to eat a well balanced diet.         Assessment/Plan # Detail Type Description   1. Assessment Low back pain, unspecified back pain laterality, with sciatica presence unspecified (M54.5).  2. Assessment Radiculopathy, lumbar region (M54.16).       3. Assessment Falling (R29.6).       4. Assessment Body mass index (BMI) 40.0-44.9, adult (Z68.41).   Plan Orders Today's instructions / counseling include(s) Lifestyle education regarding diet.         Pain Assessment/Treatment Pain Scale: 10/10. Method: Numeric Pain Intensity Scale. Location: neck. Duration: varies. Quality: discomforting. Pain Assessment/Treatment follow-up plan of care: Patient taking medication as prescribed..  Fall Risk Plan The Patient has fallen 1 times in the last year. The fall(s) resulted in injury. Details: back of head and knees. Falls risk follow-up plan of care: Assisted devices: Advised to use safety measures when avab..  Patient will undergo lumbar imaging and return to see me for repeat evaluation  Orders: Diagnostic Procedures: Assessment Procedure  M54.16 Lumbar Spine- AP/Lat/Flex/Ex  M54.16 Lumbar Spine- AP/Lat/Flex/Ex  M54.16 MRI Spine/lumb W/o Contrast  M54.16 Return to Clinic after study is performed  Instruction(s)/Education: Assessment Instruction  Z68.41 Lifestyle  education regarding diet             Provider:  Marchia Meiers. Vertell Limber MD  01/21/2015 04:02 PM Dictation edited by: Marchia Meiers. Vertell Limber    CC Providers: Kennith Maes [facility] [address] [place], [place] [address]-              Electronically signed by Marchia Meiers Vertell Limber MD on 01/21/2015 04:02 PM

## 2015-04-24 DIAGNOSIS — I10 Essential (primary) hypertension: Secondary | ICD-10-CM | POA: Diagnosis not present

## 2015-04-24 DIAGNOSIS — E162 Hypoglycemia, unspecified: Secondary | ICD-10-CM | POA: Diagnosis not present

## 2015-04-24 DIAGNOSIS — R42 Dizziness and giddiness: Secondary | ICD-10-CM | POA: Diagnosis not present

## 2015-04-24 DIAGNOSIS — E039 Hypothyroidism, unspecified: Secondary | ICD-10-CM | POA: Diagnosis not present

## 2015-04-26 MED ORDER — DEXTROSE 5 % IV SOLN
3.0000 g | INTRAVENOUS | Status: AC
  Administered 2015-04-27: 3 g via INTRAVENOUS
  Filled 2015-04-26 (×2): qty 3000

## 2015-04-27 ENCOUNTER — Inpatient Hospital Stay (HOSPITAL_COMMUNITY)
Admission: RE | Admit: 2015-04-27 | Discharge: 2015-04-29 | DRG: 460 | Disposition: A | Discharge status: Home / Self Care | Payer: Medicare Other | Source: Ambulatory Visit | Attending: Neurosurgery | Admitting: Neurosurgery

## 2015-04-27 ENCOUNTER — Inpatient Hospital Stay (HOSPITAL_COMMUNITY): Payer: Medicare Other | Admitting: Anesthesiology

## 2015-04-27 ENCOUNTER — Encounter (HOSPITAL_COMMUNITY): Payer: Self-pay | Admitting: *Deleted

## 2015-04-27 ENCOUNTER — Inpatient Hospital Stay (HOSPITAL_COMMUNITY): Payer: Medicare Other

## 2015-04-27 ENCOUNTER — Encounter (HOSPITAL_COMMUNITY)
Admission: RE | Disposition: A | Discharge status: Home / Self Care | Payer: Self-pay | Source: Ambulatory Visit | Attending: Neurosurgery

## 2015-04-27 ENCOUNTER — Inpatient Hospital Stay (HOSPITAL_COMMUNITY): Payer: Medicare Other | Admitting: Emergency Medicine

## 2015-04-27 DIAGNOSIS — G4730 Sleep apnea, unspecified: Secondary | ICD-10-CM | POA: Diagnosis present

## 2015-04-27 DIAGNOSIS — M5416 Radiculopathy, lumbar region: Secondary | ICD-10-CM | POA: Diagnosis present

## 2015-04-27 DIAGNOSIS — M4807 Spinal stenosis, lumbosacral region: Secondary | ICD-10-CM | POA: Diagnosis present

## 2015-04-27 DIAGNOSIS — Z6841 Body Mass Index (BMI) 40.0 and over, adult: Secondary | ICD-10-CM | POA: Diagnosis not present

## 2015-04-27 DIAGNOSIS — M4307 Spondylolysis, lumbosacral region: Secondary | ICD-10-CM | POA: Diagnosis not present

## 2015-04-27 DIAGNOSIS — M545 Low back pain: Secondary | ICD-10-CM | POA: Diagnosis not present

## 2015-04-27 DIAGNOSIS — I10 Essential (primary) hypertension: Secondary | ICD-10-CM | POA: Diagnosis present

## 2015-04-27 DIAGNOSIS — M4317 Spondylolisthesis, lumbosacral region: Secondary | ICD-10-CM | POA: Diagnosis not present

## 2015-04-27 DIAGNOSIS — M4326 Fusion of spine, lumbar region: Secondary | ICD-10-CM | POA: Diagnosis not present

## 2015-04-27 DIAGNOSIS — E039 Hypothyroidism, unspecified: Secondary | ICD-10-CM | POA: Diagnosis present

## 2015-04-27 DIAGNOSIS — M4306 Spondylolysis, lumbar region: Secondary | ICD-10-CM | POA: Diagnosis present

## 2015-04-27 DIAGNOSIS — F819 Developmental disorder of scholastic skills, unspecified: Secondary | ICD-10-CM | POA: Diagnosis present

## 2015-04-27 DIAGNOSIS — K219 Gastro-esophageal reflux disease without esophagitis: Secondary | ICD-10-CM | POA: Diagnosis present

## 2015-04-27 DIAGNOSIS — Z419 Encounter for procedure for purposes other than remedying health state, unspecified: Secondary | ICD-10-CM

## 2015-04-27 DIAGNOSIS — M5417 Radiculopathy, lumbosacral region: Secondary | ICD-10-CM | POA: Diagnosis not present

## 2015-04-27 DIAGNOSIS — Q762 Congenital spondylolisthesis: Secondary | ICD-10-CM

## 2015-04-27 LAB — GLUCOSE, CAPILLARY: GLUCOSE-CAPILLARY: 79 mg/dL (ref 65–99)

## 2015-04-27 SURGERY — POSTERIOR LUMBAR FUSION 1 LEVEL
Anesthesia: General | Site: Back

## 2015-04-27 MED ORDER — VECURONIUM BROMIDE 10 MG IV SOLR
INTRAVENOUS | Status: AC
  Filled 2015-04-27: qty 10

## 2015-04-27 MED ORDER — OXYCODONE-ACETAMINOPHEN 5-325 MG PO TABS
1.0000 | ORAL_TABLET | ORAL | Status: DC | PRN
  Administered 2015-04-27 – 2015-04-29 (×7): 2 via ORAL
  Filled 2015-04-27 (×6): qty 2

## 2015-04-27 MED ORDER — ONDANSETRON HCL 4 MG/2ML IJ SOLN
INTRAMUSCULAR | Status: AC
  Filled 2015-04-27: qty 2

## 2015-04-27 MED ORDER — FENTANYL CITRATE (PF) 250 MCG/5ML IJ SOLN
INTRAMUSCULAR | Status: AC
  Filled 2015-04-27: qty 5

## 2015-04-27 MED ORDER — CEFAZOLIN SODIUM 1-5 GM-% IV SOLN
INTRAVENOUS | Status: AC
  Filled 2015-04-27: qty 50

## 2015-04-27 MED ORDER — METHOCARBAMOL 500 MG PO TABS: 500.0000 mg | ORAL_TABLET | Freq: Four times a day (QID) | ORAL | Status: DC | PRN

## 2015-04-27 MED ORDER — HYDROMORPHONE HCL 1 MG/ML IJ SOLN
0.5000 mg | INTRAMUSCULAR | Status: DC | PRN
  Administered 2015-04-27: 1 mg via INTRAVENOUS
  Filled 2015-04-27: qty 1

## 2015-04-27 MED ORDER — VANCOMYCIN HCL 1000 MG IV SOLR
INTRAVENOUS | Status: AC
  Filled 2015-04-27: qty 1000

## 2015-04-27 MED ORDER — FLEET ENEMA 7-19 GM/118ML RE ENEM: 1.0000 | ENEMA | Freq: Once | RECTAL | Status: DC | PRN

## 2015-04-27 MED ORDER — MIDAZOLAM HCL 5 MG/5ML IJ SOLN
INTRAMUSCULAR | Status: DC | PRN
  Administered 2015-04-27: 2 mg via INTRAVENOUS

## 2015-04-27 MED ORDER — METHOCARBAMOL 1000 MG/10ML IJ SOLN
500.0000 mg | Freq: Four times a day (QID) | INTRAVENOUS | Status: DC | PRN
  Administered 2015-04-27: 500 mg via INTRAVENOUS
  Filled 2015-04-27: qty 5

## 2015-04-27 MED ORDER — DEXTROSE 5 % IV SOLN
500.0000 mg | INTRAVENOUS | Status: AC
Start: 2015-04-27 — End: 2015-04-28
  Filled 2015-04-27: qty 5

## 2015-04-27 MED ORDER — POLYETHYLENE GLYCOL 3350 17 G PO PACK: 17.0000 g | PACK | Freq: Every day | ORAL | Status: DC | PRN

## 2015-04-27 MED ORDER — SODIUM CHLORIDE 0.9% FLUSH: 3.0000 mL | INTRAVENOUS | Status: DC | PRN

## 2015-04-27 MED ORDER — GLYCOPYRROLATE 0.2 MG/ML IJ SOLN
INTRAMUSCULAR | Status: DC | PRN
  Administered 2015-04-27: 0.6 mg via INTRAVENOUS

## 2015-04-27 MED ORDER — MIDAZOLAM HCL 2 MG/2ML IJ SOLN
INTRAMUSCULAR | Status: AC
  Filled 2015-04-27: qty 2

## 2015-04-27 MED ORDER — MENTHOL 3 MG MT LOZG: 1.0000 | LOZENGE | OROMUCOSAL | Status: DC | PRN

## 2015-04-27 MED ORDER — METOCLOPRAMIDE HCL 5 MG/ML IJ SOLN: 10.0000 mg | Freq: Once | INTRAMUSCULAR | Status: DC | PRN

## 2015-04-27 MED ORDER — PROPOFOL 10 MG/ML IV BOLUS
INTRAVENOUS | Status: DC | PRN
  Administered 2015-04-27: 150 mg via INTRAVENOUS
  Administered 2015-04-27: 10 mg via INTRAVENOUS

## 2015-04-27 MED ORDER — ONDANSETRON HCL 4 MG/2ML IJ SOLN: 4.0000 mg | INTRAMUSCULAR | Status: DC | PRN

## 2015-04-27 MED ORDER — PRAVASTATIN SODIUM 20 MG PO TABS
20.0000 mg | ORAL_TABLET | Freq: Every day | ORAL | Status: DC
  Administered 2015-04-27 – 2015-04-28 (×2): 20 mg via ORAL
  Filled 2015-04-27 (×2): qty 1

## 2015-04-27 MED ORDER — HYDROCODONE-ACETAMINOPHEN 5-325 MG PO TABS
1.0000 | ORAL_TABLET | ORAL | Status: DC | PRN
  Administered 2015-04-27: 1 via ORAL
  Filled 2015-04-27: qty 2

## 2015-04-27 MED ORDER — BUPIVACAINE LIPOSOME 1.3 % IJ SUSP
20.0000 mL | INTRAMUSCULAR | Status: AC
  Administered 2015-04-27: 20 mL
  Filled 2015-04-27: qty 20

## 2015-04-27 MED ORDER — OXYCODONE-ACETAMINOPHEN 5-325 MG PO TABS
ORAL_TABLET | ORAL | Status: AC
  Filled 2015-04-27: qty 2

## 2015-04-27 MED ORDER — DOCUSATE SODIUM 100 MG PO CAPS
100.0000 mg | ORAL_CAPSULE | Freq: Two times a day (BID) | ORAL | Status: DC
Start: 2015-04-27 — End: 2015-04-29
  Administered 2015-04-27 – 2015-04-29 (×4): 100 mg via ORAL
  Filled 2015-04-27 (×4): qty 1

## 2015-04-27 MED ORDER — CEFAZOLIN SODIUM-DEXTROSE 2-3 GM-% IV SOLR
INTRAVENOUS | Status: AC
  Filled 2015-04-27: qty 50

## 2015-04-27 MED ORDER — ROCURONIUM BROMIDE 50 MG/5ML IV SOLN
INTRAVENOUS | Status: AC
  Filled 2015-04-27: qty 1

## 2015-04-27 MED ORDER — LIDOCAINE HCL (CARDIAC) 20 MG/ML IV SOLN
INTRAVENOUS | Status: AC
  Filled 2015-04-27: qty 5

## 2015-04-27 MED ORDER — ZOLPIDEM TARTRATE 5 MG PO TABS: 5.0000 mg | ORAL_TABLET | Freq: Every evening | ORAL | Status: DC | PRN

## 2015-04-27 MED ORDER — ALUM & MAG HYDROXIDE-SIMETH 200-200-20 MG/5ML PO SUSP
30.0000 mL | Freq: Four times a day (QID) | ORAL | Status: DC | PRN
  Administered 2015-04-28: 30 mL via ORAL
  Filled 2015-04-27: qty 30

## 2015-04-27 MED ORDER — ROCURONIUM BROMIDE 100 MG/10ML IV SOLN
INTRAVENOUS | Status: DC | PRN
  Administered 2015-04-27: 50 mg via INTRAVENOUS

## 2015-04-27 MED ORDER — DEXTROSE 5 % IV SOLN
3.0000 g | Freq: Three times a day (TID) | INTRAVENOUS | Status: AC
  Administered 2015-04-27 – 2015-04-28 (×2): 3 g via INTRAVENOUS
  Filled 2015-04-27 (×2): qty 3000

## 2015-04-27 MED ORDER — BUPIVACAINE HCL (PF) 0.5 % IJ SOLN
INTRAMUSCULAR | Status: DC | PRN
  Administered 2015-04-27: 5 mL

## 2015-04-27 MED ORDER — ARTIFICIAL TEARS OP OINT
TOPICAL_OINTMENT | OPHTHALMIC | Status: DC | PRN
  Administered 2015-04-27: 1 via OPHTHALMIC

## 2015-04-27 MED ORDER — SCOPOLAMINE 1 MG/3DAYS TD PT72
MEDICATED_PATCH | TRANSDERMAL | Status: AC
  Administered 2015-04-27: 1 via TRANSDERMAL
  Filled 2015-04-27: qty 1

## 2015-04-27 MED ORDER — PANTOPRAZOLE SODIUM 40 MG IV SOLR
40.0000 mg | Freq: Every day | INTRAVENOUS | Status: DC
  Administered 2015-04-27: 40 mg via INTRAVENOUS
  Filled 2015-04-27: qty 40

## 2015-04-27 MED ORDER — HYDROMORPHONE HCL 1 MG/ML IJ SOLN
0.2500 mg | INTRAMUSCULAR | Status: DC | PRN
  Administered 2015-04-27 (×4): 0.5 mg via INTRAVENOUS

## 2015-04-27 MED ORDER — SODIUM CHLORIDE 0.9% FLUSH
3.0000 mL | Freq: Two times a day (BID) | INTRAVENOUS | Status: DC
  Administered 2015-04-27 – 2015-04-28 (×3): 3 mL via INTRAVENOUS

## 2015-04-27 MED ORDER — SUCCINYLCHOLINE CHLORIDE 20 MG/ML IJ SOLN
INTRAMUSCULAR | Status: DC | PRN
  Administered 2015-04-27: 120 mg via INTRAVENOUS

## 2015-04-27 MED ORDER — NEOSTIGMINE METHYLSULFATE 10 MG/10ML IV SOLN
INTRAVENOUS | Status: DC | PRN
  Administered 2015-04-27: 4 mg via INTRAVENOUS

## 2015-04-27 MED ORDER — HYDROMORPHONE HCL 1 MG/ML IJ SOLN
INTRAMUSCULAR | Status: AC
  Filled 2015-04-27: qty 1

## 2015-04-27 MED ORDER — METOPROLOL TARTRATE 25 MG PO TABS
25.0000 mg | ORAL_TABLET | Freq: Two times a day (BID) | ORAL | Status: DC
  Administered 2015-04-28 (×2): 25 mg via ORAL
  Filled 2015-04-27 (×4): qty 1

## 2015-04-27 MED ORDER — SODIUM CHLORIDE 0.9 % IV SOLN: 250.0000 mL | INTRAVENOUS | Status: DC

## 2015-04-27 MED ORDER — BISACODYL 10 MG RE SUPP: 10.0000 mg | Freq: Every day | RECTAL | Status: DC | PRN

## 2015-04-27 MED ORDER — POTASSIUM CHLORIDE ER 10 MEQ PO TBCR
10.0000 meq | EXTENDED_RELEASE_TABLET | Freq: Every day | ORAL | Status: DC
  Administered 2015-04-28 – 2015-04-29 (×2): 10 meq via ORAL
  Filled 2015-04-27 (×6): qty 1

## 2015-04-27 MED ORDER — THROMBIN 20000 UNITS EX SOLR
CUTANEOUS | Status: DC | PRN
  Administered 2015-04-27: 09:00:00 via TOPICAL

## 2015-04-27 MED ORDER — 0.9 % SODIUM CHLORIDE (POUR BTL) OPTIME
TOPICAL | Status: DC | PRN
  Administered 2015-04-27: 1000 mL

## 2015-04-27 MED ORDER — ACETAMINOPHEN 650 MG RE SUPP
650.0000 mg | RECTAL | Status: DC | PRN
Start: 2015-04-27 — End: 2015-04-29

## 2015-04-27 MED ORDER — ONDANSETRON HCL 4 MG/2ML IJ SOLN
INTRAMUSCULAR | Status: DC | PRN
  Administered 2015-04-27: 4 mg via INTRAVENOUS

## 2015-04-27 MED ORDER — MONTELUKAST SODIUM 10 MG PO TABS
10.0000 mg | ORAL_TABLET | Freq: Every day | ORAL | Status: DC
  Administered 2015-04-28 – 2015-04-29 (×2): 10 mg via ORAL
  Filled 2015-04-27 (×2): qty 1

## 2015-04-27 MED ORDER — FAMOTIDINE 20 MG PO TABS
20.0000 mg | ORAL_TABLET | Freq: Two times a day (BID) | ORAL | Status: DC
  Administered 2015-04-27 – 2015-04-29 (×4): 20 mg via ORAL
  Filled 2015-04-27 (×4): qty 1

## 2015-04-27 MED ORDER — LACTATED RINGERS IV SOLN
INTRAVENOUS | Status: DC | PRN
  Administered 2015-04-27 (×2): via INTRAVENOUS

## 2015-04-27 MED ORDER — INSULIN ASPART 100 UNIT/ML ~~LOC~~ SOLN
SUBCUTANEOUS | Status: AC
  Filled 2015-04-27: qty 1

## 2015-04-27 MED ORDER — LIDOCAINE HCL (CARDIAC) 20 MG/ML IV SOLN
INTRAVENOUS | Status: DC | PRN
  Administered 2015-04-27: 50 mg via INTRAVENOUS

## 2015-04-27 MED ORDER — KCL IN DEXTROSE-NACL 20-5-0.45 MEQ/L-%-% IV SOLN
INTRAVENOUS | Status: DC
  Administered 2015-04-27: 16:00:00 via INTRAVENOUS
  Filled 2015-04-27 (×5): qty 1000

## 2015-04-27 MED ORDER — VECURONIUM BROMIDE 10 MG IV SOLR
INTRAVENOUS | Status: DC | PRN
  Administered 2015-04-27: 2 mg via INTRAVENOUS

## 2015-04-27 MED ORDER — LEVOTHYROXINE SODIUM 75 MCG PO TABS
150.0000 ug | ORAL_TABLET | Freq: Every day | ORAL | Status: DC
  Administered 2015-04-28 – 2015-04-29 (×2): 150 ug via ORAL
  Filled 2015-04-27 (×2): qty 2

## 2015-04-27 MED ORDER — ARTIFICIAL TEARS OP OINT
TOPICAL_OINTMENT | OPHTHALMIC | Status: AC
  Filled 2015-04-27: qty 3.5

## 2015-04-27 MED ORDER — STERILE WATER FOR INJECTION IJ SOLN
INTRAMUSCULAR | Status: AC
  Filled 2015-04-27: qty 20

## 2015-04-27 MED ORDER — SUCCINYLCHOLINE CHLORIDE 20 MG/ML IJ SOLN
INTRAMUSCULAR | Status: AC
  Filled 2015-04-27: qty 1

## 2015-04-27 MED ORDER — FENTANYL CITRATE (PF) 100 MCG/2ML IJ SOLN
INTRAMUSCULAR | Status: DC | PRN
  Administered 2015-04-27 (×2): 100 ug via INTRAVENOUS

## 2015-04-27 MED ORDER — VANCOMYCIN HCL 1000 MG IV SOLR
INTRAVENOUS | Status: DC | PRN
  Administered 2015-04-27: 1000 mg via TOPICAL

## 2015-04-27 MED ORDER — PHENOL 1.4 % MT LIQD: 1.0000 | OROMUCOSAL | Status: DC | PRN

## 2015-04-27 MED ORDER — PROPOFOL 10 MG/ML IV BOLUS
INTRAVENOUS | Status: AC
  Filled 2015-04-27: qty 20

## 2015-04-27 MED ORDER — ACETAMINOPHEN 325 MG PO TABS: 650.0000 mg | ORAL_TABLET | ORAL | Status: DC | PRN

## 2015-04-27 MED ORDER — LOSARTAN POTASSIUM 50 MG PO TABS
50.0000 mg | ORAL_TABLET | Freq: Every day | ORAL | Status: DC
  Administered 2015-04-28: 50 mg via ORAL
  Filled 2015-04-27 (×3): qty 1

## 2015-04-27 MED ORDER — MEPERIDINE HCL 25 MG/ML IJ SOLN: 6.2500 mg | INTRAMUSCULAR | Status: DC | PRN

## 2015-04-27 MED ORDER — LIDOCAINE-EPINEPHRINE 1 %-1:100000 IJ SOLN
INTRAMUSCULAR | Status: DC | PRN
  Administered 2015-04-27: 5 mL

## 2015-04-27 MED ORDER — SPIRONOLACTONE 25 MG PO TABS
25.0000 mg | ORAL_TABLET | Freq: Every day | ORAL | Status: DC
  Administered 2015-04-28: 25 mg via ORAL
  Filled 2015-04-27 (×3): qty 1

## 2015-04-27 MED ORDER — NEOSTIGMINE METHYLSULFATE 10 MG/10ML IV SOLN
INTRAVENOUS | Status: AC
  Filled 2015-04-27: qty 1

## 2015-04-27 SURGICAL SUPPLY — 80 items
BENZOIN TINCTURE PRP APPL 2/3 (GAUZE/BANDAGES/DRESSINGS) IMPLANT
BLADE CLIPPER SURG (BLADE) IMPLANT
BUR MATCHSTICK NEURO 3.0 LAGG (BURR) ×2 IMPLANT
BUR PRECISION FLUTE 5.0 (BURR) ×2 IMPLANT
CAGE COROENT MP 8X9X23M-8 SPIN (Cage) ×4 IMPLANT
CANISTER SUCT 3000ML PPV (MISCELLANEOUS) ×2 IMPLANT
CONT SPEC 4OZ CLIKSEAL STRL BL (MISCELLANEOUS) ×2 IMPLANT
COVER BACK TABLE 60X90IN (DRAPES) ×2 IMPLANT
DECANTER SPIKE VIAL GLASS SM (MISCELLANEOUS) ×2 IMPLANT
DERMABOND ADVANCED (GAUZE/BANDAGES/DRESSINGS) ×1
DERMABOND ADVANCED .7 DNX12 (GAUZE/BANDAGES/DRESSINGS) ×1 IMPLANT
DRAPE C-ARM 42X72 X-RAY (DRAPES) ×2 IMPLANT
DRAPE C-ARMOR (DRAPES) ×2 IMPLANT
DRAPE LAPAROTOMY 100X72X124 (DRAPES) ×2 IMPLANT
DRAPE POUCH INSTRU U-SHP 10X18 (DRAPES) ×2 IMPLANT
DRAPE SURG 17X23 STRL (DRAPES) ×2 IMPLANT
DRSG OPSITE POSTOP 4X6 (GAUZE/BANDAGES/DRESSINGS) ×2 IMPLANT
DURAPREP 26ML APPLICATOR (WOUND CARE) ×2 IMPLANT
ELECT BLADE 4.0 EZ CLEAN MEGAD (MISCELLANEOUS) ×2
ELECT REM PT RETURN 9FT ADLT (ELECTROSURGICAL) ×2
ELECTRODE BLDE 4.0 EZ CLN MEGD (MISCELLANEOUS) ×1 IMPLANT
ELECTRODE REM PT RTRN 9FT ADLT (ELECTROSURGICAL) ×1 IMPLANT
EVACUATOR 1/8 PVC DRAIN (DRAIN) ×2 IMPLANT
GAUZE SPONGE 4X4 12PLY STRL (GAUZE/BANDAGES/DRESSINGS) IMPLANT
GAUZE SPONGE 4X4 16PLY XRAY LF (GAUZE/BANDAGES/DRESSINGS) IMPLANT
GLOVE BIO SURGEON STRL SZ8 (GLOVE) ×8 IMPLANT
GLOVE BIOGEL PI IND STRL 7.0 (GLOVE) ×1 IMPLANT
GLOVE BIOGEL PI IND STRL 7.5 (GLOVE) ×2 IMPLANT
GLOVE BIOGEL PI IND STRL 8 (GLOVE) ×2 IMPLANT
GLOVE BIOGEL PI IND STRL 8.5 (GLOVE) ×2 IMPLANT
GLOVE BIOGEL PI INDICATOR 7.0 (GLOVE) ×1
GLOVE BIOGEL PI INDICATOR 7.5 (GLOVE) ×2
GLOVE BIOGEL PI INDICATOR 8 (GLOVE) ×2
GLOVE BIOGEL PI INDICATOR 8.5 (GLOVE) ×2
GLOVE ECLIPSE 8.0 STRL XLNG CF (GLOVE) ×4 IMPLANT
GLOVE EXAM NITRILE LRG STRL (GLOVE) IMPLANT
GLOVE EXAM NITRILE MD LF STRL (GLOVE) IMPLANT
GLOVE EXAM NITRILE XL STR (GLOVE) IMPLANT
GLOVE EXAM NITRILE XS STR PU (GLOVE) IMPLANT
GLOVE OPTIFIT SS 6.5 STRL BRWN (GLOVE) ×6 IMPLANT
GLOVE SS N UNI LF 7.0 STRL (GLOVE) ×6 IMPLANT
GOWN STRL REUS W/ TWL LRG LVL3 (GOWN DISPOSABLE) IMPLANT
GOWN STRL REUS W/ TWL XL LVL3 (GOWN DISPOSABLE) ×6 IMPLANT
GOWN STRL REUS W/TWL 2XL LVL3 (GOWN DISPOSABLE) ×4 IMPLANT
GOWN STRL REUS W/TWL LRG LVL3 (GOWN DISPOSABLE)
GOWN STRL REUS W/TWL XL LVL3 (GOWN DISPOSABLE) ×6
KIT BASIN OR (CUSTOM PROCEDURE TRAY) ×2 IMPLANT
KIT POSITION SURG JACKSON T1 (MISCELLANEOUS) ×2 IMPLANT
KIT ROOM TURNOVER OR (KITS) ×2 IMPLANT
MILL MEDIUM DISP (BLADE) ×2 IMPLANT
NEEDLE HYPO 21X1.5 SAFETY (NEEDLE) ×2 IMPLANT
NEEDLE HYPO 25X1 1.5 SAFETY (NEEDLE) ×2 IMPLANT
NEEDLE SPNL 18GX3.5 QUINCKE PK (NEEDLE) ×2 IMPLANT
NS IRRIG 1000ML POUR BTL (IV SOLUTION) ×2 IMPLANT
PACK FOAM VITOSS 10CC (Orthopedic Implant) ×2 IMPLANT
PACK LAMINECTOMY NEURO (CUSTOM PROCEDURE TRAY) ×2 IMPLANT
PAD ARMBOARD 7.5X6 YLW CONV (MISCELLANEOUS) ×4 IMPLANT
PATTIES SURGICAL .5 X.5 (GAUZE/BANDAGES/DRESSINGS) IMPLANT
PATTIES SURGICAL .5 X1 (DISPOSABLE) IMPLANT
PATTIES SURGICAL 1X1 (DISPOSABLE) IMPLANT
ROD RELINE-O LORD 5.5X40 (Rod) ×4 IMPLANT
SCREW LOCK RELINE 5.5 TULIP (Screw) ×8 IMPLANT
SCREW RELINE-O POLY 6.5X45 (Screw) ×4 IMPLANT
SCREW RELINE-O POLY 7.5X40 (Screw) ×4 IMPLANT
SPONGE LAP 4X18 X RAY DECT (DISPOSABLE) IMPLANT
SPONGE SURGIFOAM ABS GEL 100 (HEMOSTASIS) ×2 IMPLANT
STAPLER SKIN PROX WIDE 3.9 (STAPLE) IMPLANT
STRIP CLOSURE SKIN 1/2X4 (GAUZE/BANDAGES/DRESSINGS) ×2 IMPLANT
SUT VIC AB 1 CT1 18XBRD ANBCTR (SUTURE) ×1 IMPLANT
SUT VIC AB 1 CT1 8-18 (SUTURE) ×1
SUT VIC AB 2-0 CT1 18 (SUTURE) ×2 IMPLANT
SUT VIC AB 3-0 SH 8-18 (SUTURE) ×4 IMPLANT
SYR 20CC LL (SYRINGE) ×2 IMPLANT
SYR 3ML LL SCALE MARK (SYRINGE) IMPLANT
SYR 5ML LL (SYRINGE) IMPLANT
TOWEL OR 17X24 6PK STRL BLUE (TOWEL DISPOSABLE) ×2 IMPLANT
TOWEL OR 17X26 10 PK STRL BLUE (TOWEL DISPOSABLE) ×2 IMPLANT
TRAP SPECIMEN MUCOUS 40CC (MISCELLANEOUS) ×2 IMPLANT
TRAY FOLEY W/METER SILVER 14FR (SET/KITS/TRAYS/PACK) ×2 IMPLANT
WATER STERILE IRR 1000ML POUR (IV SOLUTION) ×2 IMPLANT

## 2015-04-27 NOTE — Anesthesia Procedure Notes (Signed)
Procedure Name: Intubation Performed by: Judeth Cornfield T Pre-anesthesia Checklist: Patient identified, Emergency Drugs available, Timeout performed, Suction available and Patient being monitored Patient Re-evaluated:Patient Re-evaluated prior to inductionOxygen Delivery Method: Circle system utilized Preoxygenation: Pre-oxygenation with 100% oxygen Intubation Type: IV induction Ventilation: Mask ventilation without difficulty and Oral airway inserted - appropriate to patient size Laryngoscope Size: Mac and 3 Grade View: Grade I Tube type: Oral Tube size: 7.5 mm Number of attempts: 1 Airway Equipment and Method: Stylet Placement Confirmation: ETT inserted through vocal cords under direct vision,  breath sounds checked- equal and bilateral,  positive ETCO2 and CO2 detector Secured at: 21 cm Tube secured with: Tape Dental Injury: Teeth and Oropharynx as per pre-operative assessment

## 2015-04-27 NOTE — Anesthesia Postprocedure Evaluation (Signed)
Anesthesia Post Note  Patient: ZAHRIYA MARSCHNER  Procedure(s) Performed: Procedure(s) (LRB): Lumbar fiveGil procedure with Lumbar five-Sacral one Posterior lumbar interbody fusion (N/A)  Patient location during evaluation: PACU Anesthesia Type: General Level of consciousness: awake and alert and oriented Pain management: satisfactory to patient Vital Signs Assessment: post-procedure vital signs reviewed and stable Respiratory status: spontaneous breathing, nonlabored ventilation, respiratory function stable and patient connected to nasal cannula oxygen Cardiovascular status: blood pressure returned to baseline and stable Postop Assessment: no signs of nausea or vomiting Anesthetic complications: no    Last Vitals:  Filed Vitals:   04/27/15 1030 04/27/15 1045  BP: 125/85 116/78  Pulse: 65 71  Temp:    Resp: 35 18    Last Pain:  Filed Vitals:   04/27/15 1050  PainSc: 3                  Kaysi Ourada A.

## 2015-04-27 NOTE — Progress Notes (Signed)
Patient ID: Audrey White, female   DOB: Mar 18, 1966, 50 y.o.   MRN: [id number] Alert, conversant, family present. MAEW. Good strength BLE. Reports only incisional pain, no leg pain. Aware of plan to mobilize in LSO. Reports cough - encouraged to move about, cough, deep breathe, & use incentive spirometer.   Verdis Prime RN BSN

## 2015-04-27 NOTE — Op Note (Signed)
04/27/2015  10:14 AM  PATIENT:  Audrey White  50 y.o. female  PRE-OPERATIVE DIAGNOSIS:  Spondylolisthesis L 5 S 1 with spondylolysis L 5, stenosis, radiculopathy, lumbago  POST-OPERATIVE DIAGNOSIS:  Spondylolisthesis L 5 S 1 with spondylolysis L 5, stenosis, radiculopathy, lumbago  PROCEDURE:  Procedure(s): Lumbar fiveGil procedure with Lumbar five-Sacral one Posterior lumbar interbody fusion (N/A) with PEEK cages, autograft, pedicle screw fixation, posterolateral arthrodesis L 5 through S 1 levels  Decompression greater than for standard PLIF procedure  SURGEON:  Surgeon(s) and Role:    * Erline Levine, MD - Primary  PHYSICIAN ASSISTANT:   ASSISTANTS: Poteat, RN   ANESTHESIA:   general  EBL:  Total I/O In: 1000 [I.V.:1000] Out: 410 [Urine:310; Blood:100]  BLOOD ADMINISTERED:none  DRAINS: none   LOCAL MEDICATIONS USED:  MARCAINE    and LIDOCAINE   SPECIMEN:  No Specimen  DISPOSITION OF SPECIMEN:  N/A  COUNTS:  YES  TOURNIQUET:  * No tourniquets in log *  DICTATION: DICTATION: Patient is 50 year old woman with L 5 spondylolysis and mobile spondylolisthesis of L5 on S1with lumbar stenosis. She has  severe bilateral L5 radiculopathies. it was elected to take her to surgery for decompression and fusion at the L 5 S1  level.   Procedure: Patient was placed in a prone position on the [place] table after smooth and uncomplicated induction of general endotracheal anesthesia. Her low back was prepped and draped in usual sterile fashion with betadine scrub and DuraPrep. Area of incision was infiltrated with local lidocaine. Incision was made to the lumbodorsal fascia was incised and exposure was performed of the L 45 and L 5-S1 spinous processes laminae facet joint and transverse processes. Intraoperative x-ray was obtained which confirmed correct orientation. A Gill procedure of L5 was performed with disarticulation of the facet joints at this level and thorough decompression was  performed of both L5 and S1 nerve roots along with the common dural tube. The cartilagenous material from the pars defect was removed with thorough decompression of both L 5 nerve roots. Decompression was greater than for standard PLIF procedure with wide decompression of both L 5, S 1 nerve roots and thecal sac.   Interspace distraction was performed after opening the disc space and a thorough discectomy af L 5  S 1 was performed.  The endplates were prepared and endplates were stripped of cartilagenous material.  The posterolateral region was extensively decorticated and vertical probes were placed at L 4 and L5 and S1 bilaterally.  After placing trial spacers, PEEK spacers packed with autograft were placed bilaterally (23 x 8 mm x 8 degree).  10 cc of autograft was packed medial to the first cage.  Intraoperative fluoroscopy confirmed correct orientationin the AP and lateral plane. 45 x 6.5 mm pedicle screws were placed at L 5 bilaterally  and 40 x 7.5 mm screws placed at S 1 bilaterally.  Final x-rays demonstrated well-positioned pedicle screw fixation. A 45 mm lordotic rod was placed on the right and a 45 mm rod was placed on the left locked down in situ and the posterolateral region was packed with the 10 cc bone autograft bilaterally  with 5 cc Vitoss bilaterally. Wound was irrigated with saline and Vancomycin.  Subcutaneous tissues were infiltrated with  20 cc of long-acting Marcaine was used in the posterolateral soft tissue. Fascia was closed with 1 Vicryl sutures skin edges were reapproximated 2 and 3-0 Vicryl sutures. The wound was dressed with Dermabond and an occlusive dressing. The patient  was extubated in the operating room and taken to recovery in stable satisfactory condition she tolerated traction well counts were correct at the end of the case.  PLAN OF CARE: Admit to inpatient   PATIENT DISPOSITION:  PACU - hemodynamically stable.   Delay start of Pharmacological VTE agent (>24hrs) due to  surgical blood loss or risk of bleeding: yes

## 2015-04-27 NOTE — Progress Notes (Signed)
Utilization review completed.  

## 2015-04-27 NOTE — Progress Notes (Signed)
Awake, alert, conversant.  MAEW with good strength bilateral DF, PF, EHL.  Doing well.

## 2015-04-27 NOTE — Interval H&P Note (Signed)
History and Physical Interval Note:  04/27/2015 7:22 AM  Audrey White  has presented today for surgery, with the diagnosis of Spondylolisthesis, Lumbosacral region  The various methods of treatment have been discussed with the patient and family. After consideration of risks, benefits and other options for treatment, the patient has consented to  Procedure(s) with comments: L5 gill procedure with L5-S1 Posterior lumbar interbody fusion (N/A) - L5 gill procedure with L5-S1 Posterior lumbar interbody fusion as a surgical intervention .  The patient's history has been reviewed, patient examined, no change in status, stable for surgery.  I have reviewed the patient's chart and labs.  Questions were answered to the patient's satisfaction.     Tamu Golz D

## 2015-04-27 NOTE — Brief Op Note (Signed)
04/27/2015  10:14 AM  PATIENT:  Audrey White  50 y.o. female  PRE-OPERATIVE DIAGNOSIS:  Spondylolisthesis L 5 S 1 with spondylolysis L 5, stenosis, radiculopathy, lumbago  POST-OPERATIVE DIAGNOSIS:  Spondylolisthesis L 5 S 1 with spondylolysis L 5, stenosis, radiculopathy, lumbago  PROCEDURE:  Procedure(s): Lumbar fiveGil procedure with Lumbar five-Sacral one Posterior lumbar interbody fusion (N/A) with PEEK cages, autograft, pedicle screw fixation, posterolateral arthrodesis L 5 through S 1 levels  Decompression greater than for standard PLIF procedure  SURGEON:  Surgeon(s) and Role:    * Erline Levine, MD - Primary  PHYSICIAN ASSISTANT:   ASSISTANTS: Poteat, RN   ANESTHESIA:   general  EBL:  Total I/O In: 1000 [I.V.:1000] Out: 410 [Urine:310; Blood:100]  BLOOD ADMINISTERED:none  DRAINS: none   LOCAL MEDICATIONS USED:  MARCAINE    and LIDOCAINE   SPECIMEN:  No Specimen  DISPOSITION OF SPECIMEN:  N/A  COUNTS:  YES  TOURNIQUET:  * No tourniquets in log *  DICTATION: DICTATION: Patient is 50 year old woman with L 5 spondylolysis and mobile spondylolisthesis of L5 on S1with lumbar stenosis. She has  severe bilateral L5 radiculopathies. it was elected to take her to surgery for decompression and fusion at the L 5 S1  level.   Procedure: Patient was placed in a prone position on the [place] table after smooth and uncomplicated induction of general endotracheal anesthesia. Her low back was prepped and draped in usual sterile fashion with betadine scrub and DuraPrep. Area of incision was infiltrated with local lidocaine. Incision was made to the lumbodorsal fascia was incised and exposure was performed of the L 45 and L 5-S1 spinous processes laminae facet joint and transverse processes. Intraoperative x-ray was obtained which confirmed correct orientation. A Gill procedure of L5 was performed with disarticulation of the facet joints at this level and thorough decompression was  performed of both L5 and S1 nerve roots along with the common dural tube. The cartilagenous material from the pars defect was removed with thorough decompression of both L 5 nerve roots. Decompression was greater than for standard PLIF procedure with wide decompression of both L 5, S 1 nerve roots and thecal sac.   Interspace distraction was performed after opening the disc space and a thorough discectomy af L 5  S 1 was performed.  The endplates were prepared and endplates were stripped of cartilagenous material.  The posterolateral region was extensively decorticated and vertical probes were placed at L 4 and L5 and S1 bilaterally.  After placing trial spacers, PEEK spacers packed with autograft were placed bilaterally (23 x 8 mm x 8 degree).  10 cc of autograft was packed medial to the first cage.  Intraoperative fluoroscopy confirmed correct orientationin the AP and lateral plane. 45 x 6.5 mm pedicle screws were placed at L 5 bilaterally  and 40 x 7.5 mm screws placed at S 1 bilaterally.  Final x-rays demonstrated well-positioned pedicle screw fixation. A 45 mm lordotic rod was placed on the right and a 45 mm rod was placed on the left locked down in situ and the posterolateral region was packed with the 10 cc bone autograft bilaterally  with 5 cc Vitoss bilaterally. Wound was irrigated with saline and Vancomycin.  Subcutaneous tissues were infiltrated with  20 cc of long-acting Marcaine was used in the posterolateral soft tissue. Fascia was closed with 1 Vicryl sutures skin edges were reapproximated 2 and 3-0 Vicryl sutures. The wound was dressed with Dermabond and an occlusive dressing. The patient  was extubated in the operating room and taken to recovery in stable satisfactory condition she tolerated traction well counts were correct at the end of the case.  PLAN OF CARE: Admit to inpatient   PATIENT DISPOSITION:  PACU - hemodynamically stable.   Delay start of Pharmacological VTE agent (>24hrs) due to  surgical blood loss or risk of bleeding: yes

## 2015-04-27 NOTE — Progress Notes (Signed)
Patient arrived to 5C18 from PACU accompanied by family Safety precautions and orders reviewed with family. ICS given. Pain medication administered per requested. No other distress noted. Will continue to monitor.   Ave Filter, RN

## 2015-04-27 NOTE — Transfer of Care (Signed)
Immediate Anesthesia Transfer of Care Note  Patient: Audrey White  Procedure(s) Performed: Procedure(s): Lumbar fiveGil procedure with Lumbar five-Sacral one Posterior lumbar interbody fusion (N/A)  Patient Location: PACU  Anesthesia Type:General  Level of Consciousness: patient cooperative and responds to stimulation  Airway & Oxygen Therapy: Patient Spontanous Breathing and Patient connected to nasal cannula oxygen  Post-op Assessment: Report given to RN and Post -op Vital signs reviewed and stable  Post vital signs: Reviewed and stable  Last Vitals:  Filed Vitals:   04/27/15 1015 04/27/15 1017  BP:  124/75  Pulse:  69  Temp: 36.5 C   Resp:  26    Complications: No apparent anesthesia complications

## 2015-04-27 NOTE — Anesthesia Preprocedure Evaluation (Addendum)
Anesthesia Evaluation  Patient identified by MRN, date of birth, ID band Patient awake    Reviewed: Allergy & Precautions, NPO status , Patient's Chart, lab work & pertinent test results, reviewed documented beta blocker date and time   History of Anesthesia Complications (+) history of anesthetic complications  Airway Mallampati: III  TM Distance: >3 FB Neck ROM: Full    Dental no notable dental hx. (+) Teeth Intact   Pulmonary sleep apnea , pneumonia, resolved,    Pulmonary exam normal breath sounds clear to auscultation       Cardiovascular hypertension, Pt. on medications and Pt. on home beta blockers Normal cardiovascular exam Rhythm:Regular Rate:Normal  Cardiac w/u last year negative   Neuro/Psych  Headaches, Learning disability negative psych ROS   GI/Hepatic Neg liver ROS, GERD  Medicated and Controlled,  Endo/Other  Hypothyroidism   Renal/GU negative Renal ROS  negative genitourinary   Musculoskeletal negative musculoskeletal ROS (+)   Abdominal (+) + obese,   Peds  Hematology Erythrocytosis   Anesthesia Other Findings   Reproductive/Obstetrics negative OB ROS                            Anesthesia Physical Anesthesia Plan  ASA: III  Anesthesia Plan: General   Post-op Pain Management:    Induction: Intravenous  Airway Management Planned: Oral ETT  Additional Equipment:   Intra-op Plan:   Post-operative Plan: Extubation in OR  Informed Consent: I have reviewed the patients History and Physical, chart, labs and discussed the procedure including the risks, benefits and alternatives for the proposed anesthesia with the patient or authorized representative who has indicated his/her understanding and acceptance.   Dental advisory given  Plan Discussed with: CRNA, Anesthesiologist and Surgeon  Anesthesia Plan Comments:         Anesthesia Quick Evaluation

## 2015-04-28 MED ORDER — PANTOPRAZOLE SODIUM 40 MG PO TBEC
40.0000 mg | DELAYED_RELEASE_TABLET | Freq: Every day | ORAL | Status: DC
  Administered 2015-04-28: 40 mg via ORAL
  Filled 2015-04-28: qty 1

## 2015-04-28 NOTE — Evaluation (Signed)
Physical Therapy Evaluation Patient Details Name: Audrey White MRN: [id number] DOB: June 11, 1965 Today's Date: 04/28/2015   History of Present Illness  L5-S1 PLIF PMHx-morbid obesity, ACDF, Bil carpal tunnel release   Clinical Impression  Patient is s/p above surgery resulting in the deficits listed below (see PT Problem List).  Patient will benefit from skilled PT to increase their independence and safety with mobility (while adhering to their precautions) to allow discharge to the venue listed below.     Follow Up Recommendations No PT follow up;Supervision for mobility/OOB    Equipment Recommendations  None recommended by PT    Recommendations for Other Services       Precautions / Restrictions Precautions Precautions: Back;Fall Precaution Booklet Issued: Yes (comment) Precaution Comments: educated patient and mother via handout  Required Braces or Orthoses: Spinal Brace Spinal Brace: Lumbar corset;Applied in sitting position (adjusted in standing)      Mobility  Bed Mobility               General bed mobility comments: not tested; up in chair and returned to chair  Transfers Overall transfer level: Needs assistance Equipment used: Rolling walker (2 wheeled) Transfers: Sit to/from Stand Sit to Stand: Min guard         General transfer comment: x 3; vc for safe use of RW  Ambulation/Gait Ambulation/Gait assistance: Min assist Ambulation Distance (Feet): 130 Feet Assistive device: Rolling walker (2 wheeled) Gait Pattern/deviations: Step-through pattern;Decreased stride length   Gait velocity interpretation: Below normal speed for age/gender General Gait Details: knees wobbling throughout walk (pt reports that is normal for her); assist to maneuver RW (especially in tight spaces  Stairs            Wheelchair Mobility    Modified Rankin (Stroke Patients Only)       Balance                                              Pertinent Vitals/Pain Pain Assessment: 0-10 Pain Score: 6  Pain Location: back Pain Descriptors / Indicators: Operative site guarding Pain Intervention(s): Limited activity within patient's tolerance;Monitored during session;Repositioned;Patient requesting pain meds-RN notified    Home Living Family/patient expects to be discharged to:: Private residence Living Arrangements: Parent Available Help at Discharge: Family;Available 24 hours/day Type of Home: House Home Access: Ramped entrance     Home Layout: One level Home Equipment: Walker - 2 wheels;Cane - quad;Bedside commode;Wheelchair - manual (all equipment was her father's )      Prior Function Level of Independence: Independent with assistive device(s)         Comments: walked with quad cane most of the time; reports at least 4 falls in past year "knees just got weak and gave out"     Hand Dominance        Extremity/Trunk Assessment   Upper Extremity Assessment: Overall WFL for tasks assessed;Defer to OT evaluation           Lower Extremity Assessment: Generalized weakness      Cervical / Trunk Assessment: Normal  Communication   Communication: Expressive difficulties (slow and slurred at baseline)  Cognition Arousal/Alertness: Awake/alert Behavior During Therapy: WFL for tasks assessed/performed Overall Cognitive Status: Within Functional Limits for tasks assessed                      General  Comments General comments (skin integrity, edema, etc.): family present throughout    Exercises        Assessment/Plan    PT Assessment Patient needs continued PT services  PT Diagnosis Difficulty walking;Acute pain   PT Problem List Decreased strength;Decreased balance;Decreased mobility;Decreased knowledge of use of DME;Decreased knowledge of precautions;Obesity;Pain  PT Treatment Interventions DME instruction;Gait training;Functional mobility training;Therapeutic activities;Patient/family  education   PT Goals (Current goals can be found in the Care Plan section) Acute Rehab PT Goals Patient Stated Goal: be able to tolerate lying down to sleelp PT Goal Formulation: With patient Time For Goal Achievement: 05/03/15 Potential to Achieve Goals: Good    Frequency Min 5X/week   Barriers to discharge        Co-evaluation               End of Session Equipment Utilized During Treatment: Gait belt;Back brace Activity Tolerance: Patient tolerated treatment well Patient left: in chair;with call bell/phone within reach;with chair alarm set;with family/visitor present Nurse Communication: Mobility status;Patient requests pain meds;Other (comment) (family requesting linens changed)         Time: [id number] PT Time Calculation (min) (ACUTE ONLY): 34 min   Charges:   PT Evaluation $PT Eval Moderate Complexity: 1 Procedure PT Treatments $Gait Training: 8-22 mins   PT G Codes:        Jalisa Sacco 05/20/15, 10:09 AM Pager [phone]

## 2015-04-28 NOTE — Care Management Note (Signed)
Case Management Note  Patient Details  Name: Audrey White MRN: [id number] Date of Birth: 1965-12-15  Subjective/Objective:                    Action/Plan: Patient was admitted for an L5-S1 PLIF.  Will follow for discharge needs pending PT/OT evals and physician orders.  Expected Discharge Date:                  Expected Discharge Plan:     In-House Referral:     Discharge planning Services     Post Acute Care Choice:    Choice offered to:     DME Arranged:    DME Agency:     HH Arranged:    HH Agency:     Status of Service:  In process, will continue to follow  Medicare Important Message Given:    Date Medicare IM Given:    Medicare IM give by:    Date Additional Medicare IM Given:    Additional Medicare Important Message give by:     If discussed at [place] of Stay Meetings, dates discussed:    Additional CommentsRolm Baptise, RN 04/28/2015, 10:59 AM [phone]

## 2015-04-28 NOTE — Progress Notes (Signed)
Subjective: Patient reports "I feel good. I was hungry!"  Objective: Vital signs in last 24 hours: Temp:  [97.7 F (36.5 C)-99.1 F (37.3 C)] 99.1 F (37.3 C) (02/10 0616) Pulse Rate:  [56-99] 72 (02/10 0616) Resp:  [0-35] 18 (02/10 0616) BP: (101-141)/(62-114) 101/62 mmHg (02/10 0616) SpO2:  [92 %-100 %] 95 % (02/10 0616)  Intake/Output from previous day: 02/09 0701 - 02/10 0700 In: 1900 [P.O.:600; I.V.:1300] Out: 2710 [Urine:2610; Blood:100] Intake/Output this shift: Total I/O In: 240 [P.O.:240] Out: -   Sitting in chair, finishing breakfast, smiling. Reports no pain at present, but recalls difficulty finding position of comfort and lumbar pain through the night. Good strength BLE.  Lab Results: No results for input(s): WBC, HGB, HCT, PLT in the last 72 hours. BMET No results for input(s): NA, K, CL, CO2, GLUCOSE, BUN, CREATININE, CALCIUM in the last 72 hours.  Studies/Results: Dg Lumbar Spine 2-3 Views  04/27/2015  CLINICAL DATA:  Localization Image for Lumbar Five Gil Procedure with Lumbar Five-Sacral One Posterior Lumbar Interbody Fusion for Spondylolisthesis. ; L5-S1 GIL Procedure L5-S1 PLIF for Spondylolisthesis. EXAM: LUMBAR SPINE - 1 VIEW; LUMBAR SPINE - 2-3 VIEW; DG C-ARM 61-120 MIN COMPARISON:  None. FINDINGS: FILM #3: Transpedicular screws are identified at L5 and S1. Interbody fusion device identified at L5-S1. FILM #4: Frontal view shows posterior retractor in place. Transpedicular screws are identified at L5-S1. FILM #5: Frontal view shows posterior retractor in place. Transpedicular screws and interbody fusion devices identified at L5-S1. IMPRESSION: Intraoperative images during posterior fusion L5-S1. Electronically Signed   By: Nolon Nations M.D.   On: 04/27/2015 10:31   Dg Lumbar Spine 1 View  04/27/2015  CLINICAL DATA:  Localization Image for Lumbar Five Gil Procedure with Lumbar Five-Sacral One Posterior Lumbar Interbody Fusion for Spondylolisthesis. ; L5-S1  GIL Procedure L5-S1 PLIF for Spondylolisthesis. EXAM: LUMBAR SPINE - 1 VIEW; LUMBAR SPINE - 2-3 VIEW; DG C-ARM 61-120 MIN COMPARISON:  None. FINDINGS: FILM #3: Transpedicular screws are identified at L5 and S1. Interbody fusion device identified at L5-S1. FILM #4: Frontal view shows posterior retractor in place. Transpedicular screws are identified at L5-S1. FILM #5: Frontal view shows posterior retractor in place. Transpedicular screws and interbody fusion devices identified at L5-S1. IMPRESSION: Intraoperative images during posterior fusion L5-S1. Electronically Signed   By: Nolon Nations M.D.   On: 04/27/2015 10:31   Dg C-arm 1-60 Min  04/27/2015  CLINICAL DATA:  Localization Image for Lumbar Five Gil Procedure with Lumbar Five-Sacral One Posterior Lumbar Interbody Fusion for Spondylolisthesis. ; L5-S1 GIL Procedure L5-S1 PLIF for Spondylolisthesis. EXAM: LUMBAR SPINE - 1 VIEW; LUMBAR SPINE - 2-3 VIEW; DG C-ARM 61-120 MIN COMPARISON:  None. FINDINGS: FILM #3: Transpedicular screws are identified at L5 and S1. Interbody fusion device identified at L5-S1. FILM #4: Frontal view shows posterior retractor in place. Transpedicular screws are identified at L5-S1. FILM #5: Frontal view shows posterior retractor in place. Transpedicular screws and interbody fusion devices identified at L5-S1. IMPRESSION: Intraoperative images during posterior fusion L5-S1. Electronically Signed   By: Nolon Nations M.D.   On: 04/27/2015 10:31    Assessment/Plan: Improving   LOS: 1 day  Mobilize in LSO with PT.    Verdis Prime 04/28/2015, 8:26 AM

## 2015-04-28 NOTE — Evaluation (Signed)
Occupational Therapy Evaluation Patient Details Name: STANLEY PARISEAU MRN: [id number] DOB: [birth year] Today's Date: 04/28/2015    History of Present Illness L5-S1 PLIF PMHx-morbid obesity, ACDF, Bil carpal tunnel release    Clinical Impression   Pt reports she was independent with ADLs PTA. Min guard for functional mobility but requiring min assist for UB ADLs and mod-max assist for LB ADLs. Discussed use of AE for increased independence; pt unsure if she wants to purchase, will plan to practice next session. Began safety, ADL, and back education with pt and family. Pt planning to d/c home with 24/7 supervision from her mother. Pt would benefit from continued skilled OT to address established goals.    Follow Up Recommendations  No OT follow up;Supervision - Intermittent    Equipment Recommendations  3 in 1 bedside comode;Other (comment) (unsure if pt has 3in1 already. Adaptive equipment.)    Recommendations for Other Services       Precautions / Restrictions Precautions Precautions: Back;Fall Precaution Booklet Issued: Yes (comment) Precaution Comments: Pt able to recall 2/3 precautions at start of session. Reviewed all precautions with pt and mother. Required Braces or Orthoses: Spinal Brace Spinal Brace: Lumbar corset;Applied in sitting position Restrictions Weight Bearing Restrictions: No      Mobility Bed Mobility Overal bed mobility: Needs Assistance Bed Mobility: Rolling;Sit to Sidelying Rolling: Min guard       Sit to sidelying: Min assist General bed mobility comments: Min assist for managing LEs into bed. Pt with difficulty following log roll technique despite max verbal cues for sequencing and safety.  Transfers Overall transfer level: Needs assistance Equipment used: Rolling walker (2 wheeled) Transfers: Sit to/from Stand Sit to Stand: Min guard         General transfer comment: Min guard for safety; no physical assist required. Sit to stand from  chair x 1, BSC x1. Good hand placement and technique.    Balance Overall balance assessment: Needs assistance Sitting-balance support: Feet supported;No upper extremity supported Sitting balance-Leahy Scale: Fair     Standing balance support: No upper extremity supported;During functional activity Standing balance-Leahy Scale: Fair Standing balance comment: Pt able to stand at sink and wash hands without UE support.                            ADL Overall ADL's : Needs assistance/impaired Eating/Feeding: Set up;Sitting   Grooming: Min guard;Standing;Wash/dry hands Grooming Details (indicate cue type and reason): Educated on use of cup for oral care. Upper Body Bathing: Minimal assitance;Sitting   Lower Body Bathing: Moderate assistance;Sit to/from stand   Upper Body Dressing : Minimal assistance;Sitting Upper Body Dressing Details (indicate cue type and reason): Min assist given for doffing back brace sitting EOB. Lower Body Dressing: Maximal assistance;Sit to/from stand Lower Body Dressing Details (indicate cue type and reason): Pt unable to cross one foot over opposite knee. Discussed use of AE for increased independence with LB ADLs; pt reports she would like to think about it since insurance does not cover the cost of AE. Pt reports mother can assist as needed too. Toilet Transfer: Min guard;Ambulation;BSC;RW (BSC over toilet)   Toileting- Clothing Manipulation and Hygiene: Total assistance;Sit to/from stand Toileting - Clothing Manipulation Details (indicate cue type and reason): Pt unable to complete toilet hygiene maintaining back precautions and body habitus.     Functional mobility during ADLs: Min guard;Rolling walker General ADL Comments: Pts mother and other family present for OT eval. Educated on  back precautions, maintaining precautions during ADL/IADL activities, home safety, donning/doffing brace in sitting, keeping frequently used items at counter top  height.      Vision     Perception     Praxis      Pertinent Vitals/Pain Pain Assessment: 0-10 Pain Score: 8  Pain Location: back Pain Descriptors / Indicators: Aching;Operative site guarding Pain Intervention(s): Limited activity within patient's tolerance;Monitored during session;Repositioned;RN gave pain meds during session     Hand Dominance     Extremity/Trunk Assessment Upper Extremity Assessment Upper Extremity Assessment: Overall WFL for tasks assessed   Lower Extremity Assessment Lower Extremity Assessment: Defer to PT evaluation   Cervical / Trunk Assessment Cervical / Trunk Assessment: Normal   Communication Communication Communication: Expressive difficulties (slow and slurred at baseline)   Cognition Arousal/Alertness: Awake/alert Behavior During Therapy: WFL for tasks assessed/performed Overall Cognitive Status: Within Functional Limits for tasks assessed                     General Comments       Exercises       Shoulder Instructions      Home Living Family/patient expects to be discharged to:: Private residence Living Arrangements: Parent Available Help at Discharge: Family;Available 24 hours/day Type of Home: House Home Access: Ramped entrance     Home Layout: One level     Bathroom Shower/Tub: Teacher, early years/pre: Standard     Home Equipment: Environmental consultant - 2 wheels;Cane - quad;Bedside commode;Wheelchair - manual          Prior Functioning/Environment Level of Independence: Independent with assistive device(s)        Comments: walked with quad cane most of the time; reports at least 4 falls in past year "knees just got weak and gave out"    OT Diagnosis: Generalized weakness;Acute pain   OT Problem List: Decreased activity tolerance;Impaired balance (sitting and/or standing);Decreased knowledge of use of DME or AE;Decreased knowledge of precautions;Obesity;Pain   OT Treatment/Interventions: Self-care/ADL  training;Energy conservation;DME and/or AE instruction;Patient/family education;Balance training    OT Goals(Current goals can be found in the care plan section) Acute Rehab OT Goals Patient Stated Goal: return home OT Goal Formulation: With patient/family Time For Goal Achievement: 05/12/15 Potential to Achieve Goals: Good ADL Goals Pt Will Perform Lower Body Bathing: with supervision;sit to/from stand;with adaptive equipment Pt Will Perform Lower Body Dressing: with supervision;with adaptive equipment;sit to/from stand Pt Will Transfer to Toilet: with supervision;ambulating;bedside commode (over toilet) Pt Will Perform Toileting - Clothing Manipulation and hygiene: with supervision;with adaptive equipment;sit to/from stand Additional ADL Goal #1: Pt will independently don/doff back brace. Additional ADL Goal #2: Pt will independently maintain back precautions throughout ADL activity.  OT Frequency: Min 2X/week   Barriers to D/C:            Co-evaluation              End of Session Equipment Utilized During Treatment: Gait belt;Rolling walker;Back brace  Activity Tolerance: Patient tolerated treatment well Patient left: in bed;with call bell/phone within reach;with family/visitor present   Time: 1005-1033 OT Time Calculation (min): 28 min Charges:  OT General Charges $OT Visit: 1 Procedure OT Evaluation $OT Eval Moderate Complexity: 1 Procedure OT Treatments $Self Care/Home Management : 8-22 mins G-Codes:     Binnie Kand M.S., OTR/L Pager: [phone]  04/28/2015, 10:59 AM

## 2015-04-28 NOTE — Progress Notes (Signed)
Pt ambulated to chair with 2 assist and brace. Sitting in chair with alarm activated. Will continue to monitor.

## 2015-04-28 NOTE — Progress Notes (Signed)
Patient ID: Audrey White, female   DOB: 07-13-1965, 50 y.o.   MRN: [id number] Sitting in chair, reports no pain or discomfort. Family has gone, planning to return in am for discharge home. Walked with PT today without difficulties. Pt verbalizes understanding of d/c instructions. She already has f/u appt scheduled with DrStern.   Verdis Prime RN BSN

## 2015-04-29 MED ORDER — METHOCARBAMOL 500 MG PO TABS: 500.0000 mg | ORAL_TABLET | Freq: Four times a day (QID) | ORAL | Status: DC | PRN

## 2015-04-29 MED ORDER — SENNA 8.6 MG PO TABS
1.0000 | ORAL_TABLET | Freq: Every day | ORAL | Status: DC
  Administered 2015-04-29: 8.6 mg via ORAL
  Filled 2015-04-29: qty 1

## 2015-04-29 MED ORDER — HYDROCODONE-ACETAMINOPHEN 5-325 MG PO TABS: 1.0000 | ORAL_TABLET | ORAL | Status: DC | PRN

## 2015-04-29 NOTE — Progress Notes (Signed)
Occupational Therapy Treatment Patient Details Name: JOYLYNN NEWBERG MRN: [id number] DOB: 1965/12/24 Today's Date: 04/29/2015    History of present illness L5-S1 PLIF PMHx-morbid obesity, ACDF, Bil carpal tunnel release    OT comments  Pt at adequate level for d/c home and family will be able to provide 24/7 (A).   Follow Up Recommendations  No OT follow up;Supervision - Intermittent    Equipment Recommendations  3 in 1 bedside comode;Other (comment)    Recommendations for Other Services      Precautions / Restrictions Precautions Precautions: Back;Fall Precaution Comments: able to state 3/3 with incr time; no cues needed to adhere Required Braces or Orthoses: Spinal Brace Spinal Brace: Lumbar corset;Applied in sitting position       Mobility Bed Mobility Overal bed mobility: Needs Assistance Bed Mobility: Rolling;Sidelying to Sit Rolling: Supervision Sidelying to sit: Min guard       General bed mobility comments: up with PT on arrival  Transfers Overall transfer level: Needs assistance Equipment used: Rolling walker (2 wheeled) Transfers: Sit to/from Stand Sit to Stand: Min guard         General transfer comment: cues for hand placement    Balance                                   ADL                           Toilet Transfer: Minimal assistance;Ambulation;RW;BSC   Toileting- Clothing Manipulation and Hygiene: Total assistance;Sit to/from stand         General ADL Comments: pt and mother plan to complete LB dressing together. Educated on don of LB clothing and UB shirt for proper positioning.       Vision                     Perception     Praxis      Cognition   Behavior During Therapy: WFL for tasks assessed/performed Overall Cognitive Status: Within Functional Limits for tasks assessed                       Extremity/Trunk Assessment               Exercises     Shoulder  Instructions       General Comments      Pertinent Vitals/ Pain       Pain Assessment: 0-10 Pain Score: 8  Pain Location: back Pain Descriptors / Indicators: Operative site guarding Pain Intervention(s): Monitored during session;Repositioned;Premedicated before session  Home Living                                          Prior Functioning/Environment              Frequency Min 2X/week     Progress Toward Goals  OT Goals(current goals can now be found in the care plan section)  Progress towards OT goals: Progressing toward goals  Acute Rehab OT Goals Patient Stated Goal: be able to tolerate lying down to sleelp OT Goal Formulation: With patient/family Time For Goal Achievement: 05/12/15 Potential to Achieve Goals: Good ADL Goals Pt Will Perform Lower Body Bathing: with supervision;sit to/from stand;with adaptive equipment Pt Will Perform  Lower Body Dressing: with supervision;with adaptive equipment;sit to/from stand Pt Will Transfer to Toilet: with supervision;ambulating;bedside commode Pt Will Perform Toileting - Clothing Manipulation and hygiene: with supervision;with adaptive equipment;sit to/from stand Additional ADL Goal #1: Pt will independently don/doff back brace. Additional ADL Goal #2: Pt will independently maintain back precautions throughout ADL activity.  Plan Discharge plan remains appropriate    Co-evaluation                 End of Session Equipment Utilized During Treatment: Rolling walker;Gait belt;Back brace   Activity Tolerance Patient tolerated treatment well   Patient Left in chair;with call bell/phone within reach;with family/visitor present   Nurse Communication Mobility status;Precautions        Time: [id number] OT Time Calculation (min): 15 min  Charges: OT General Charges $OT Visit: 1 Procedure OT Treatments $Self Care/Home Management : 8-22 mins  Parke Poisson B 04/29/2015, 1:46 PM  Jeri Modena    OTR/L Pager: [phone] Office: [phone] .

## 2015-04-29 NOTE — Progress Notes (Addendum)
Physical Therapy Treatment Patient Details Name: Audrey White MRN: [id number] DOB: 21-May-1965 Today's Date: 04/29/2015    History of Present Illness L5-S1 PLIF PMHx-morbid obesity, ACDF, Bil carpal tunnel release     PT Comments    Patient anxious to go home. Reported episode of dizziness when she got up earlier. Completed orthostatic BPs which were negative (See vitals flow sheet). Required much fewer cues re: use of RW and no cues to adhere to back precautions. Mother present and both feel comfortable with patient discharging home (from a mobility standpoint).    Follow Up Recommendations  No PT follow up;Supervision for mobility/OOB     Equipment Recommendations  None recommended by PT    Recommendations for Other Services       Precautions / Restrictions Precautions Precautions: Back;Fall Precaution Comments: able to state 3/3 with incr time; no cues needed to adhere Required Braces or Orthoses: Spinal Brace Spinal Brace: Lumbar corset;Applied in sitting position (adjusted in standing)    Mobility  Bed Mobility Overal bed mobility: Needs Assistance Bed Mobility: Rolling;Sidelying to Sit Rolling: Supervision Sidelying to sit: Min guard       General bed mobility comments: vc for technique; no assist needed  Transfers Overall transfer level: Needs assistance Equipment used: Rolling walker (2 wheeled) Transfers: Sit to/from Stand Sit to Stand: Supervision         General transfer comment: x 3; vc for safe use of RW x1, then demonstrated without cues  Ambulation/Gait Ambulation/Gait assistance: Min guard Ambulation Distance (Feet): 70 Feet Assistive device: Rolling walker (2 wheeled) Gait Pattern/deviations: Step-through pattern;Decreased stride length;Shuffle   Gait velocity interpretation: Below normal speed for age/gender General Gait Details: knees wobbling throughout walk (pt/mother report that is normal for her);    Stairs             Wheelchair Mobility    Modified Rankin (Stroke Patients Only)       Balance                                    Cognition Arousal/Alertness: Awake/alert Behavior During Therapy: WFL for tasks assessed/performed Overall Cognitive Status: Within Functional Limits for tasks assessed                      Exercises      General Comments        Pertinent Vitals/Pain Pain Assessment: 0-10 Pain Score: 8  Pain Location: back Pain Descriptors / Indicators: Operative site guarding Pain Intervention(s): Limited activity within patient's tolerance;Monitored during session;Repositioned;RN gave pain meds during session    Home Living                      Prior Function            PT Goals (current goals can now be found in the care plan section) Acute Rehab PT Goals Patient Stated Goal: be able to tolerate lying down to sleelp PT Goal Formulation: With patient Time For Goal Achievement: 05/03/15 Potential to Achieve Goals: Good Progress towards PT goals: Progressing toward goals    Frequency  Min 5X/week    PT Plan Current plan remains appropriate    Co-evaluation             End of Session Equipment Utilized During Treatment: Gait belt;Back brace Activity Tolerance: Patient tolerated treatment well Patient left:  (in bathroom with OT )  Time: [id number] PT Time Calculation (min) (ACUTE ONLY): 30 min  Charges:  $Gait Training: 8-22 mins $Therapeutic Activity: 8-22 mins                    G Codes:      Audrey White 2015/05/20, 11:21 AM  Pager [phone]

## 2015-04-29 NOTE — Care Management Note (Signed)
Case Management Note  Patient Details  Name: Audrey White MRN: [id number] Date of Birth: 1965-07-17  Subjective/Objective:                  PLIF Action/Plan: Discharge  planning Expected Discharge Date:  04/29/15              Expected Discharge Plan:  Home/Self Care  In-House Referral:     Discharge planning Services  CM Consult  Post Acute Care Choice:  Durable Medical Equipment Choice offered to:  Patient  DME Arranged:  3-N-1, Walker rolling DME Agency:  [place]:  NA [place] Agency:     Status of Service:  Completed, signed off  Medicare Important Message Given:    Date Medicare IM Given:    Medicare IM give by:    Date Additional Medicare IM Given:    Additional Medicare Important Message give by:     If discussed at [place] of Stay Meetings, dates discussed:    Additional Comments: CM received call to please arrange for rolling wlaker and 3n1.  Cm called [facility] DME rep, Merry Proud to please deliver the DME to room so pt can discharge.  No other CM needs were communicated. Dellie Catholic, RN 04/29/2015, 9:54 AM

## 2015-04-29 NOTE — Progress Notes (Signed)
Patient ready for discharge to home; equipment delivered to room; discharge instructions given and reviewed with patient and her mother; Rx given.  Patient assisted with bath and dressed for discharge; discharged out via wheelchair; accompanied home by her mother.

## 2015-04-29 NOTE — Discharge Summary (Signed)
Physician Discharge Summary  Patient ID: Audrey White MRN: [id number] DOB/AGE: February 17, 1966 50 y.o.  Admit date: 04/27/2015 Discharge date: 04/29/2015  Admission Diagnoses: stenosis with instability   Discharge Diagnoses: same   Discharged Condition: good  Hospital Course: The patient was admitted on 04/27/2015 and taken to the operating room where the patient underwent PLIF. The patient tolerated the procedure well and was taken to the recovery room and then to the floor in stable condition. The hospital course was routine. There were no complications. The wound remained clean dry and intact. Pt had appropriate back soreness. No complaints of leg pain or new N/T/W. The patient remained afebrile with stable vital signs, and tolerated a regular diet. The patient continued to increase activities, and pain was well controlled with oral pain medications.   Consults: None  Significant Diagnostic Studies:  Results for orders placed or performed during the hospital encounter of 04/27/15  Glucose, capillary  Result Value Ref Range   Glucose-Capillary 79 65 - 99 mg/dL   Comment 1 Notify RN    Comment 2 Document in Chart     Dg Lumbar Spine 2-3 Views  04/27/2015  CLINICAL DATA:  Localization Image for Lumbar Five Gil Procedure with Lumbar Five-Sacral One Posterior Lumbar Interbody Fusion for Spondylolisthesis. ; L5-S1 GIL Procedure L5-S1 PLIF for Spondylolisthesis. EXAM: LUMBAR SPINE - 1 VIEW; LUMBAR SPINE - 2-3 VIEW; DG C-ARM 61-120 MIN COMPARISON:  None. FINDINGS: FILM #3: Transpedicular screws are identified at L5 and S1. Interbody fusion device identified at L5-S1. FILM #4: Frontal view shows posterior retractor in place. Transpedicular screws are identified at L5-S1. FILM #5: Frontal view shows posterior retractor in place. Transpedicular screws and interbody fusion devices identified at L5-S1. IMPRESSION: Intraoperative images during posterior fusion L5-S1. Electronically Signed   By: Nolon Nations M.D.   On: 04/27/2015 10:31   Dg Lumbar Spine 1 View  04/27/2015  CLINICAL DATA:  Localization Image for Lumbar Five Gil Procedure with Lumbar Five-Sacral One Posterior Lumbar Interbody Fusion for Spondylolisthesis. ; L5-S1 GIL Procedure L5-S1 PLIF for Spondylolisthesis. EXAM: LUMBAR SPINE - 1 VIEW; LUMBAR SPINE - 2-3 VIEW; DG C-ARM 61-120 MIN COMPARISON:  None. FINDINGS: FILM #3: Transpedicular screws are identified at L5 and S1. Interbody fusion device identified at L5-S1. FILM #4: Frontal view shows posterior retractor in place. Transpedicular screws are identified at L5-S1. FILM #5: Frontal view shows posterior retractor in place. Transpedicular screws and interbody fusion devices identified at L5-S1. IMPRESSION: Intraoperative images during posterior fusion L5-S1. Electronically Signed   By: Nolon Nations M.D.   On: 04/27/2015 10:31   Dg C-arm 1-60 Min  04/27/2015  CLINICAL DATA:  Localization Image for Lumbar Five Gil Procedure with Lumbar Five-Sacral One Posterior Lumbar Interbody Fusion for Spondylolisthesis. ; L5-S1 GIL Procedure L5-S1 PLIF for Spondylolisthesis. EXAM: LUMBAR SPINE - 1 VIEW; LUMBAR SPINE - 2-3 VIEW; DG C-ARM 61-120 MIN COMPARISON:  None. FINDINGS: FILM #3: Transpedicular screws are identified at L5 and S1. Interbody fusion device identified at L5-S1. FILM #4: Frontal view shows posterior retractor in place. Transpedicular screws are identified at L5-S1. FILM #5: Frontal view shows posterior retractor in place. Transpedicular screws and interbody fusion devices identified at L5-S1. IMPRESSION: Intraoperative images during posterior fusion L5-S1. Electronically Signed   By: Nolon Nations M.D.   On: 04/27/2015 10:31    Antibiotics:  Anti-infectives    Start     Dose/Rate Route Frequency Ordered Stop   04/27/15 1600  ceFAZolin (ANCEF) 3 g in dextrose  5 % 50 mL IVPB     3 g 130 mL/hr over 30 Minutes Intravenous Every 8 hours 04/27/15 1537 04/28/15 0051   04/27/15 0846   vancomycin (VANCOCIN) powder  Status:  Discontinued       As needed 04/27/15 0846 04/27/15 1013   04/27/15 0733  vancomycin (VANCOCIN) 1000 MG powder    Comments:  Loreli Dollar   : cabinet override      04/27/15 0733 04/27/15 1944   04/27/15 0733  ceFAZolin (ANCEF) 1-5 GM-% IVPB    Comments:  Loreli Dollar   : cabinet override      04/27/15 0733 04/27/15 1944   04/27/15 0600  ceFAZolin (ANCEF) 3 g in dextrose 5 % 50 mL IVPB     3 g 130 mL/hr over 30 Minutes Intravenous On call to O.R. 04/26/15 1259 04/27/15 0737      Discharge Exam: Blood pressure 97/67, pulse 72, temperature 98.4 F (36.9 C), temperature source Oral, resp. rate 20, height 5\' 6"  (1.676 m), weight 121.564 kg (268 lb), SpO2 97 %. Neurologic: Grossly normal Dressing dry  Discharge Medications:     Medication List    TAKE these medications        HYDROcodone-acetaminophen 5-325 MG tablet  Commonly known as:  NORCO/VICODIN  Take 1-2 tablets by mouth every 4 (four) hours as needed (mild pain).     losartan 50 MG tablet  Commonly known as:  COZAAR  Take 50 mg by mouth daily.     methocarbamol 500 MG tablet  Commonly known as:  ROBAXIN  Take 1 tablet (500 mg total) by mouth every 6 (six) hours as needed for muscle spasms.     metoprolol tartrate 25 MG tablet  Commonly known as:  LOPRESSOR  Take 25 mg by mouth every 12 (twelve) hours.     montelukast 10 MG tablet  Commonly known as:  SINGULAIR  Take 10 mg by mouth daily.     potassium chloride 10 MEQ tablet  Commonly known as:  K-DUR  Take 10 mEq by mouth daily.     pravastatin 20 MG tablet  Commonly known as:  PRAVACHOL  Take 20 mg by mouth at bedtime.     ranitidine 150 MG tablet  Commonly known as:  ZANTAC  Take 150 mg by mouth daily.     spironolactone 25 MG tablet  Commonly known as:  ALDACTONE  Take 25 mg by mouth daily.     SYNTHROID 150 MCG tablet  Generic drug:  levothyroxine  Take 150 mcg by mouth daily.        Disposition:  home   Final Dx: PLIF      Discharge Instructions    Call MD for:  difficulty breathing, headache or visual disturbances    Complete by:  As directed      Call MD for:  persistant nausea and vomiting    Complete by:  As directed      Call MD for:  redness, tenderness, or signs of infection (pain, swelling, redness, odor or green/yellow discharge around incision site)    Complete by:  As directed      Call MD for:  severe uncontrolled pain    Complete by:  As directed      Call MD for:  temperature >100.4    Complete by:  As directed      Diet - low sodium heart healthy    Complete by:  As directed      Discharge  instructions    Complete by:  As directed   No heavy lifting, no bending or twisting, may shower     Increase activity slowly    Complete by:  As directed      Remove dressing in 48 hours    Complete by:  As directed               Signed: Afrah Burlison S 04/29/2015, 9:28 AM

## 2015-05-17 DIAGNOSIS — M4306 Spondylolysis, lumbar region: Secondary | ICD-10-CM | POA: Diagnosis not present

## 2015-05-17 DIAGNOSIS — M4120 Other idiopathic scoliosis, site unspecified: Secondary | ICD-10-CM | POA: Diagnosis not present

## 2015-05-17 DIAGNOSIS — M4317 Spondylolisthesis, lumbosacral region: Secondary | ICD-10-CM | POA: Diagnosis not present

## 2015-05-17 DIAGNOSIS — I10 Essential (primary) hypertension: Secondary | ICD-10-CM | POA: Diagnosis not present

## 2015-05-17 DIAGNOSIS — Z6841 Body Mass Index (BMI) 40.0 and over, adult: Secondary | ICD-10-CM | POA: Diagnosis not present

## 2015-05-17 DIAGNOSIS — M4807 Spinal stenosis, lumbosacral region: Secondary | ICD-10-CM | POA: Diagnosis not present

## 2015-05-17 DIAGNOSIS — M5416 Radiculopathy, lumbar region: Secondary | ICD-10-CM | POA: Diagnosis not present

## 2015-06-21 DIAGNOSIS — Z6841 Body Mass Index (BMI) 40.0 and over, adult: Secondary | ICD-10-CM | POA: Diagnosis not present

## 2015-06-21 DIAGNOSIS — M4306 Spondylolysis, lumbar region: Secondary | ICD-10-CM | POA: Diagnosis not present

## 2015-06-21 DIAGNOSIS — M5416 Radiculopathy, lumbar region: Secondary | ICD-10-CM | POA: Diagnosis not present

## 2015-06-21 DIAGNOSIS — M4317 Spondylolisthesis, lumbosacral region: Secondary | ICD-10-CM | POA: Diagnosis not present

## 2015-08-05 DIAGNOSIS — J324 Chronic pansinusitis: Secondary | ICD-10-CM | POA: Diagnosis not present

## 2015-09-06 DIAGNOSIS — H1045 Other chronic allergic conjunctivitis: Secondary | ICD-10-CM | POA: Diagnosis not present

## 2015-09-06 DIAGNOSIS — H04129 Dry eye syndrome of unspecified lacrimal gland: Secondary | ICD-10-CM | POA: Diagnosis not present

## 2015-09-24 DIAGNOSIS — M79641 Pain in right hand: Secondary | ICD-10-CM | POA: Diagnosis not present

## 2015-09-28 DIAGNOSIS — I10 Essential (primary) hypertension: Secondary | ICD-10-CM | POA: Diagnosis not present

## 2015-09-28 DIAGNOSIS — M65341 Trigger finger, right ring finger: Secondary | ICD-10-CM | POA: Diagnosis not present

## 2015-09-28 DIAGNOSIS — E039 Hypothyroidism, unspecified: Secondary | ICD-10-CM | POA: Diagnosis not present

## 2015-09-28 DIAGNOSIS — Z0000 Encounter for general adult medical examination without abnormal findings: Secondary | ICD-10-CM | POA: Diagnosis not present

## 2015-09-28 DIAGNOSIS — N898 Other specified noninflammatory disorders of vagina: Secondary | ICD-10-CM | POA: Diagnosis not present

## 2015-10-13 DIAGNOSIS — Z1231 Encounter for screening mammogram for malignant neoplasm of breast: Secondary | ICD-10-CM | POA: Diagnosis not present

## 2015-10-20 DIAGNOSIS — M65341 Trigger finger, right ring finger: Secondary | ICD-10-CM | POA: Diagnosis not present

## 2015-10-30 DIAGNOSIS — M5416 Radiculopathy, lumbar region: Secondary | ICD-10-CM | POA: Diagnosis not present

## 2015-10-30 DIAGNOSIS — Z6841 Body Mass Index (BMI) 40.0 and over, adult: Secondary | ICD-10-CM | POA: Diagnosis not present

## 2015-10-30 DIAGNOSIS — M4306 Spondylolysis, lumbar region: Secondary | ICD-10-CM | POA: Diagnosis not present

## 2015-10-30 DIAGNOSIS — M4317 Spondylolisthesis, lumbosacral region: Secondary | ICD-10-CM | POA: Diagnosis not present

## 2015-10-30 DIAGNOSIS — M4120 Other idiopathic scoliosis, site unspecified: Secondary | ICD-10-CM | POA: Diagnosis not present

## 2015-12-26 DIAGNOSIS — Z23 Encounter for immunization: Secondary | ICD-10-CM | POA: Diagnosis not present

## 2016-01-16 DIAGNOSIS — Z79899 Other long term (current) drug therapy: Secondary | ICD-10-CM | POA: Diagnosis not present

## 2016-01-16 DIAGNOSIS — H6121 Impacted cerumen, right ear: Secondary | ICD-10-CM | POA: Diagnosis not present

## 2016-01-16 DIAGNOSIS — H9202 Otalgia, left ear: Secondary | ICD-10-CM | POA: Diagnosis not present

## 2016-01-16 DIAGNOSIS — E039 Hypothyroidism, unspecified: Secondary | ICD-10-CM | POA: Diagnosis not present

## 2016-01-16 DIAGNOSIS — E162 Hypoglycemia, unspecified: Secondary | ICD-10-CM | POA: Diagnosis not present

## 2016-01-30 DIAGNOSIS — G501 Atypical facial pain: Secondary | ICD-10-CM | POA: Diagnosis not present

## 2016-01-30 DIAGNOSIS — H9202 Otalgia, left ear: Secondary | ICD-10-CM | POA: Diagnosis not present

## 2016-02-05 DIAGNOSIS — H9202 Otalgia, left ear: Secondary | ICD-10-CM | POA: Diagnosis not present

## 2016-02-05 DIAGNOSIS — H5712 Ocular pain, left eye: Secondary | ICD-10-CM | POA: Diagnosis not present

## 2016-02-14 DIAGNOSIS — G501 Atypical facial pain: Secondary | ICD-10-CM | POA: Diagnosis not present

## 2016-02-14 DIAGNOSIS — H9202 Otalgia, left ear: Secondary | ICD-10-CM | POA: Diagnosis not present

## 2016-02-27 DIAGNOSIS — E041 Nontoxic single thyroid nodule: Secondary | ICD-10-CM | POA: Diagnosis not present

## 2016-02-27 DIAGNOSIS — E042 Nontoxic multinodular goiter: Secondary | ICD-10-CM | POA: Diagnosis not present

## 2016-03-07 DIAGNOSIS — E041 Nontoxic single thyroid nodule: Secondary | ICD-10-CM | POA: Diagnosis not present

## 2016-03-07 DIAGNOSIS — E039 Hypothyroidism, unspecified: Secondary | ICD-10-CM | POA: Diagnosis not present

## 2016-03-15 DIAGNOSIS — E782 Mixed hyperlipidemia: Secondary | ICD-10-CM | POA: Diagnosis not present

## 2016-03-15 DIAGNOSIS — K219 Gastro-esophageal reflux disease without esophagitis: Secondary | ICD-10-CM | POA: Diagnosis not present

## 2016-03-15 DIAGNOSIS — I10 Essential (primary) hypertension: Secondary | ICD-10-CM | POA: Diagnosis not present

## 2016-03-15 DIAGNOSIS — E039 Hypothyroidism, unspecified: Secondary | ICD-10-CM | POA: Diagnosis not present

## 2016-03-18 IMAGING — CR DG LUMBAR SPINE 1V
1 series · 1 of 1 positions shown · non-contrast
Comparison: None.

CLINICAL DATA: Localization Image for Lumbar Five Saw Procedure
with Lumbar Five-Sacral One Posterior Lumbar Interbody Fusion for
Spondylolisthesis. ; L5-S1 MOODY Procedure L5-S1 PLIF for
Spondylolisthesis.

EXAM:
LUMBAR SPINE - 1 VIEW; LUMBAR SPINE - 2-3 VIEW; DG C-ARM 61-120 MIN

[lat]
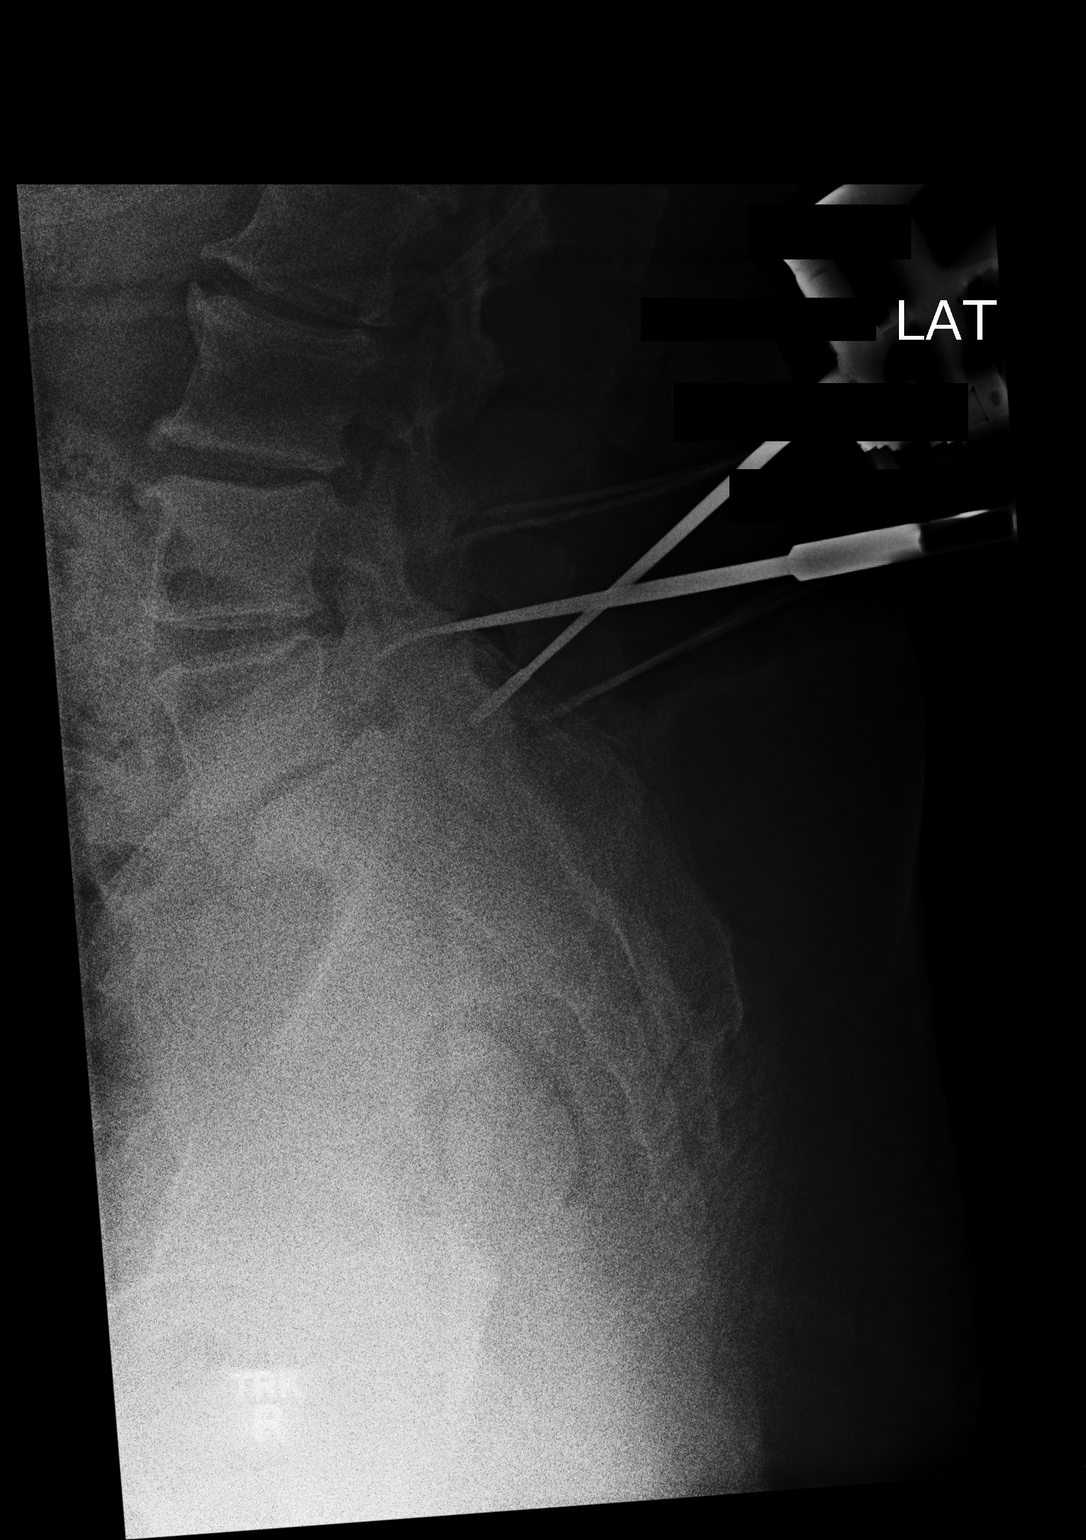

[1 of 1 positions shown; findings below may reference images not displayed]

FINDINGS: FILM #3: Transpedicular screws are identified at L5 and S1.
Interbody fusion device identified at L5-S1.

FILM #4: Frontal view shows posterior retractor in place.
Transpedicular screws are identified at L5-S1.

FILM #5: Frontal view shows posterior retractor in place.
Transpedicular screws and interbody fusion devices identified at
L5-S1.
IMPRESSION: Intraoperative images during posterior fusion L5-S1.

## 2016-03-26 DIAGNOSIS — J0100 Acute maxillary sinusitis, unspecified: Secondary | ICD-10-CM | POA: Diagnosis not present

## 2016-06-07 DIAGNOSIS — D582 Other hemoglobinopathies: Secondary | ICD-10-CM | POA: Diagnosis not present

## 2016-06-07 DIAGNOSIS — E785 Hyperlipidemia, unspecified: Secondary | ICD-10-CM | POA: Diagnosis not present

## 2016-06-07 DIAGNOSIS — E039 Hypothyroidism, unspecified: Secondary | ICD-10-CM | POA: Diagnosis not present

## 2016-06-07 DIAGNOSIS — Z6841 Body Mass Index (BMI) 40.0 and over, adult: Secondary | ICD-10-CM | POA: Diagnosis not present

## 2016-06-07 DIAGNOSIS — Z79899 Other long term (current) drug therapy: Secondary | ICD-10-CM | POA: Diagnosis not present

## 2016-06-07 DIAGNOSIS — R739 Hyperglycemia, unspecified: Secondary | ICD-10-CM | POA: Diagnosis not present

## 2016-06-07 DIAGNOSIS — D519 Vitamin B12 deficiency anemia, unspecified: Secondary | ICD-10-CM | POA: Diagnosis not present

## 2016-06-07 DIAGNOSIS — E782 Mixed hyperlipidemia: Secondary | ICD-10-CM | POA: Diagnosis not present

## 2016-06-07 DIAGNOSIS — R6889 Other general symptoms and signs: Secondary | ICD-10-CM | POA: Diagnosis not present

## 2016-06-07 DIAGNOSIS — D751 Secondary polycythemia: Secondary | ICD-10-CM | POA: Diagnosis not present

## 2016-06-07 DIAGNOSIS — I10 Essential (primary) hypertension: Secondary | ICD-10-CM | POA: Diagnosis not present

## 2016-06-10 DIAGNOSIS — Z6841 Body Mass Index (BMI) 40.0 and over, adult: Secondary | ICD-10-CM | POA: Diagnosis not present

## 2016-06-10 DIAGNOSIS — R42 Dizziness and giddiness: Secondary | ICD-10-CM | POA: Diagnosis not present

## 2016-06-10 DIAGNOSIS — I361 Nonrheumatic tricuspid (valve) insufficiency: Secondary | ICD-10-CM | POA: Diagnosis not present

## 2016-06-10 DIAGNOSIS — Z862 Personal history of diseases of the blood and blood-forming organs and certain disorders involving the immune mechanism: Secondary | ICD-10-CM | POA: Diagnosis not present

## 2016-06-10 DIAGNOSIS — R270 Ataxia, unspecified: Secondary | ICD-10-CM | POA: Diagnosis not present

## 2016-06-10 DIAGNOSIS — E039 Hypothyroidism, unspecified: Secondary | ICD-10-CM | POA: Diagnosis not present

## 2016-06-10 DIAGNOSIS — R531 Weakness: Secondary | ICD-10-CM | POA: Diagnosis not present

## 2016-06-10 DIAGNOSIS — R001 Bradycardia, unspecified: Secondary | ICD-10-CM | POA: Diagnosis not present

## 2016-06-10 DIAGNOSIS — E785 Hyperlipidemia, unspecified: Secondary | ICD-10-CM | POA: Diagnosis not present

## 2016-06-10 DIAGNOSIS — Z79899 Other long term (current) drug therapy: Secondary | ICD-10-CM | POA: Diagnosis not present

## 2016-06-10 DIAGNOSIS — I10 Essential (primary) hypertension: Secondary | ICD-10-CM | POA: Diagnosis not present

## 2016-06-10 DIAGNOSIS — R5381 Other malaise: Secondary | ICD-10-CM | POA: Diagnosis not present

## 2016-06-11 DIAGNOSIS — I10 Essential (primary) hypertension: Secondary | ICD-10-CM | POA: Diagnosis not present

## 2016-06-11 DIAGNOSIS — E039 Hypothyroidism, unspecified: Secondary | ICD-10-CM | POA: Diagnosis not present

## 2016-06-11 DIAGNOSIS — R42 Dizziness and giddiness: Secondary | ICD-10-CM | POA: Diagnosis not present

## 2016-06-11 DIAGNOSIS — E785 Hyperlipidemia, unspecified: Secondary | ICD-10-CM | POA: Diagnosis not present

## 2016-06-11 DIAGNOSIS — R270 Ataxia, unspecified: Secondary | ICD-10-CM | POA: Diagnosis not present

## 2016-06-11 DIAGNOSIS — R001 Bradycardia, unspecified: Secondary | ICD-10-CM | POA: Diagnosis not present

## 2016-06-13 DIAGNOSIS — I10 Essential (primary) hypertension: Secondary | ICD-10-CM | POA: Diagnosis not present

## 2016-06-13 DIAGNOSIS — Z9181 History of falling: Secondary | ICD-10-CM | POA: Diagnosis not present

## 2016-06-13 DIAGNOSIS — M6281 Muscle weakness (generalized): Secondary | ICD-10-CM | POA: Diagnosis not present

## 2016-06-13 DIAGNOSIS — R270 Ataxia, unspecified: Secondary | ICD-10-CM | POA: Diagnosis not present

## 2016-06-13 DIAGNOSIS — R42 Dizziness and giddiness: Secondary | ICD-10-CM | POA: Diagnosis not present

## 2016-06-13 DIAGNOSIS — M1990 Unspecified osteoarthritis, unspecified site: Secondary | ICD-10-CM | POA: Diagnosis not present

## 2016-06-19 DIAGNOSIS — Z9181 History of falling: Secondary | ICD-10-CM | POA: Diagnosis not present

## 2016-06-19 DIAGNOSIS — M1990 Unspecified osteoarthritis, unspecified site: Secondary | ICD-10-CM | POA: Diagnosis not present

## 2016-06-19 DIAGNOSIS — M6281 Muscle weakness (generalized): Secondary | ICD-10-CM | POA: Diagnosis not present

## 2016-06-19 DIAGNOSIS — I10 Essential (primary) hypertension: Secondary | ICD-10-CM | POA: Diagnosis not present

## 2016-06-19 DIAGNOSIS — R42 Dizziness and giddiness: Secondary | ICD-10-CM | POA: Diagnosis not present

## 2016-06-19 DIAGNOSIS — R270 Ataxia, unspecified: Secondary | ICD-10-CM | POA: Diagnosis not present

## 2016-06-20 DIAGNOSIS — Z9181 History of falling: Secondary | ICD-10-CM | POA: Diagnosis not present

## 2016-06-20 DIAGNOSIS — M6281 Muscle weakness (generalized): Secondary | ICD-10-CM | POA: Diagnosis not present

## 2016-06-20 DIAGNOSIS — I10 Essential (primary) hypertension: Secondary | ICD-10-CM | POA: Diagnosis not present

## 2016-06-20 DIAGNOSIS — R270 Ataxia, unspecified: Secondary | ICD-10-CM | POA: Diagnosis not present

## 2016-06-20 DIAGNOSIS — M1990 Unspecified osteoarthritis, unspecified site: Secondary | ICD-10-CM | POA: Diagnosis not present

## 2016-06-20 DIAGNOSIS — R42 Dizziness and giddiness: Secondary | ICD-10-CM | POA: Diagnosis not present

## 2016-06-24 DIAGNOSIS — R42 Dizziness and giddiness: Secondary | ICD-10-CM | POA: Diagnosis not present

## 2016-06-24 DIAGNOSIS — Z9181 History of falling: Secondary | ICD-10-CM | POA: Diagnosis not present

## 2016-06-24 DIAGNOSIS — M1990 Unspecified osteoarthritis, unspecified site: Secondary | ICD-10-CM | POA: Diagnosis not present

## 2016-06-24 DIAGNOSIS — R29898 Other symptoms and signs involving the musculoskeletal system: Secondary | ICD-10-CM | POA: Diagnosis not present

## 2016-06-24 DIAGNOSIS — R270 Ataxia, unspecified: Secondary | ICD-10-CM | POA: Diagnosis not present

## 2016-06-24 DIAGNOSIS — M6281 Muscle weakness (generalized): Secondary | ICD-10-CM | POA: Diagnosis not present

## 2016-06-24 DIAGNOSIS — D582 Other hemoglobinopathies: Secondary | ICD-10-CM | POA: Diagnosis not present

## 2016-06-24 DIAGNOSIS — Z6841 Body Mass Index (BMI) 40.0 and over, adult: Secondary | ICD-10-CM | POA: Diagnosis not present

## 2016-06-24 DIAGNOSIS — I10 Essential (primary) hypertension: Secondary | ICD-10-CM | POA: Diagnosis not present

## 2016-06-27 DIAGNOSIS — I10 Essential (primary) hypertension: Secondary | ICD-10-CM | POA: Diagnosis not present

## 2016-06-27 DIAGNOSIS — Z9181 History of falling: Secondary | ICD-10-CM | POA: Diagnosis not present

## 2016-06-27 DIAGNOSIS — M1990 Unspecified osteoarthritis, unspecified site: Secondary | ICD-10-CM | POA: Diagnosis not present

## 2016-06-27 DIAGNOSIS — R270 Ataxia, unspecified: Secondary | ICD-10-CM | POA: Diagnosis not present

## 2016-06-27 DIAGNOSIS — M6281 Muscle weakness (generalized): Secondary | ICD-10-CM | POA: Diagnosis not present

## 2016-06-27 DIAGNOSIS — R42 Dizziness and giddiness: Secondary | ICD-10-CM | POA: Diagnosis not present

## 2016-07-01 DIAGNOSIS — I10 Essential (primary) hypertension: Secondary | ICD-10-CM | POA: Diagnosis not present

## 2016-07-01 DIAGNOSIS — R270 Ataxia, unspecified: Secondary | ICD-10-CM | POA: Diagnosis not present

## 2016-07-01 DIAGNOSIS — M6281 Muscle weakness (generalized): Secondary | ICD-10-CM | POA: Diagnosis not present

## 2016-07-01 DIAGNOSIS — M1990 Unspecified osteoarthritis, unspecified site: Secondary | ICD-10-CM | POA: Diagnosis not present

## 2016-07-01 DIAGNOSIS — Z9181 History of falling: Secondary | ICD-10-CM | POA: Diagnosis not present

## 2016-07-01 DIAGNOSIS — R42 Dizziness and giddiness: Secondary | ICD-10-CM | POA: Diagnosis not present

## 2016-07-02 DIAGNOSIS — D751 Secondary polycythemia: Secondary | ICD-10-CM | POA: Diagnosis not present

## 2016-07-02 DIAGNOSIS — D729 Disorder of white blood cells, unspecified: Secondary | ICD-10-CM | POA: Diagnosis not present

## 2016-07-02 DIAGNOSIS — R7989 Other specified abnormal findings of blood chemistry: Secondary | ICD-10-CM | POA: Diagnosis not present

## 2016-07-04 DIAGNOSIS — Z9181 History of falling: Secondary | ICD-10-CM | POA: Diagnosis not present

## 2016-07-04 DIAGNOSIS — R42 Dizziness and giddiness: Secondary | ICD-10-CM | POA: Diagnosis not present

## 2016-07-04 DIAGNOSIS — M1990 Unspecified osteoarthritis, unspecified site: Secondary | ICD-10-CM | POA: Diagnosis not present

## 2016-07-04 DIAGNOSIS — I10 Essential (primary) hypertension: Secondary | ICD-10-CM | POA: Diagnosis not present

## 2016-07-04 DIAGNOSIS — M6281 Muscle weakness (generalized): Secondary | ICD-10-CM | POA: Diagnosis not present

## 2016-07-04 DIAGNOSIS — R270 Ataxia, unspecified: Secondary | ICD-10-CM | POA: Diagnosis not present

## 2016-07-08 DIAGNOSIS — I10 Essential (primary) hypertension: Secondary | ICD-10-CM | POA: Diagnosis not present

## 2016-07-08 DIAGNOSIS — M1990 Unspecified osteoarthritis, unspecified site: Secondary | ICD-10-CM | POA: Diagnosis not present

## 2016-07-08 DIAGNOSIS — Z9181 History of falling: Secondary | ICD-10-CM | POA: Diagnosis not present

## 2016-07-08 DIAGNOSIS — R270 Ataxia, unspecified: Secondary | ICD-10-CM | POA: Diagnosis not present

## 2016-07-08 DIAGNOSIS — R42 Dizziness and giddiness: Secondary | ICD-10-CM | POA: Diagnosis not present

## 2016-07-08 DIAGNOSIS — M6281 Muscle weakness (generalized): Secondary | ICD-10-CM | POA: Diagnosis not present

## 2016-07-10 DIAGNOSIS — Z9181 History of falling: Secondary | ICD-10-CM | POA: Diagnosis not present

## 2016-07-10 DIAGNOSIS — R42 Dizziness and giddiness: Secondary | ICD-10-CM | POA: Diagnosis not present

## 2016-07-10 DIAGNOSIS — I10 Essential (primary) hypertension: Secondary | ICD-10-CM | POA: Diagnosis not present

## 2016-07-10 DIAGNOSIS — M6281 Muscle weakness (generalized): Secondary | ICD-10-CM | POA: Diagnosis not present

## 2016-07-10 DIAGNOSIS — M1990 Unspecified osteoarthritis, unspecified site: Secondary | ICD-10-CM | POA: Diagnosis not present

## 2016-07-10 DIAGNOSIS — R270 Ataxia, unspecified: Secondary | ICD-10-CM | POA: Diagnosis not present

## 2016-07-15 DIAGNOSIS — H9190 Unspecified hearing loss, unspecified ear: Secondary | ICD-10-CM | POA: Diagnosis not present

## 2016-07-15 DIAGNOSIS — H8111 Benign paroxysmal vertigo, right ear: Secondary | ICD-10-CM | POA: Diagnosis not present

## 2016-07-15 DIAGNOSIS — I10 Essential (primary) hypertension: Secondary | ICD-10-CM | POA: Diagnosis not present

## 2016-07-15 DIAGNOSIS — E039 Hypothyroidism, unspecified: Secondary | ICD-10-CM | POA: Diagnosis not present

## 2016-07-15 DIAGNOSIS — F418 Other specified anxiety disorders: Secondary | ICD-10-CM | POA: Diagnosis not present

## 2016-07-15 DIAGNOSIS — J449 Chronic obstructive pulmonary disease, unspecified: Secondary | ICD-10-CM | POA: Diagnosis not present

## 2016-07-15 DIAGNOSIS — R51 Headache: Secondary | ICD-10-CM | POA: Diagnosis not present

## 2016-07-15 DIAGNOSIS — R42 Dizziness and giddiness: Secondary | ICD-10-CM | POA: Diagnosis not present

## 2016-07-24 DIAGNOSIS — R42 Dizziness and giddiness: Secondary | ICD-10-CM | POA: Diagnosis not present

## 2016-07-24 DIAGNOSIS — R2689 Other abnormalities of gait and mobility: Secondary | ICD-10-CM | POA: Diagnosis not present

## 2016-07-26 DIAGNOSIS — R2689 Other abnormalities of gait and mobility: Secondary | ICD-10-CM | POA: Diagnosis not present

## 2016-07-26 DIAGNOSIS — R42 Dizziness and giddiness: Secondary | ICD-10-CM | POA: Diagnosis not present

## 2016-07-29 DIAGNOSIS — L03113 Cellulitis of right upper limb: Secondary | ICD-10-CM | POA: Diagnosis not present

## 2016-07-29 DIAGNOSIS — Z6841 Body Mass Index (BMI) 40.0 and over, adult: Secondary | ICD-10-CM | POA: Diagnosis not present

## 2016-07-29 DIAGNOSIS — M79641 Pain in right hand: Secondary | ICD-10-CM | POA: Diagnosis not present

## 2016-07-31 DIAGNOSIS — R42 Dizziness and giddiness: Secondary | ICD-10-CM | POA: Diagnosis not present

## 2016-07-31 DIAGNOSIS — R2689 Other abnormalities of gait and mobility: Secondary | ICD-10-CM | POA: Diagnosis not present

## 2016-08-02 DIAGNOSIS — R42 Dizziness and giddiness: Secondary | ICD-10-CM | POA: Diagnosis not present

## 2016-08-02 DIAGNOSIS — R2689 Other abnormalities of gait and mobility: Secondary | ICD-10-CM | POA: Diagnosis not present

## 2016-08-07 DIAGNOSIS — R42 Dizziness and giddiness: Secondary | ICD-10-CM | POA: Diagnosis not present

## 2016-08-07 DIAGNOSIS — R2689 Other abnormalities of gait and mobility: Secondary | ICD-10-CM | POA: Diagnosis not present

## 2016-08-09 DIAGNOSIS — R42 Dizziness and giddiness: Secondary | ICD-10-CM | POA: Diagnosis not present

## 2016-08-09 DIAGNOSIS — R2689 Other abnormalities of gait and mobility: Secondary | ICD-10-CM | POA: Diagnosis not present

## 2016-08-16 DIAGNOSIS — R2689 Other abnormalities of gait and mobility: Secondary | ICD-10-CM | POA: Diagnosis not present

## 2016-08-16 DIAGNOSIS — R42 Dizziness and giddiness: Secondary | ICD-10-CM | POA: Diagnosis not present

## 2016-08-26 DIAGNOSIS — R42 Dizziness and giddiness: Secondary | ICD-10-CM | POA: Diagnosis not present

## 2016-08-26 DIAGNOSIS — R2689 Other abnormalities of gait and mobility: Secondary | ICD-10-CM | POA: Diagnosis not present

## 2016-09-02 DIAGNOSIS — R42 Dizziness and giddiness: Secondary | ICD-10-CM | POA: Diagnosis not present

## 2016-09-02 DIAGNOSIS — R2689 Other abnormalities of gait and mobility: Secondary | ICD-10-CM | POA: Diagnosis not present

## 2016-09-09 DIAGNOSIS — R42 Dizziness and giddiness: Secondary | ICD-10-CM | POA: Diagnosis not present

## 2016-09-09 DIAGNOSIS — R2689 Other abnormalities of gait and mobility: Secondary | ICD-10-CM | POA: Diagnosis not present

## 2016-09-13 DIAGNOSIS — D729 Disorder of white blood cells, unspecified: Secondary | ICD-10-CM | POA: Diagnosis not present

## 2016-09-13 DIAGNOSIS — D751 Secondary polycythemia: Secondary | ICD-10-CM | POA: Diagnosis not present

## 2016-09-30 DIAGNOSIS — E785 Hyperlipidemia, unspecified: Secondary | ICD-10-CM | POA: Diagnosis not present

## 2016-09-30 DIAGNOSIS — Z0000 Encounter for general adult medical examination without abnormal findings: Secondary | ICD-10-CM | POA: Diagnosis not present

## 2016-09-30 DIAGNOSIS — Z79899 Other long term (current) drug therapy: Secondary | ICD-10-CM | POA: Diagnosis not present

## 2016-09-30 DIAGNOSIS — E039 Hypothyroidism, unspecified: Secondary | ICD-10-CM | POA: Diagnosis not present

## 2016-10-14 DIAGNOSIS — Z1231 Encounter for screening mammogram for malignant neoplasm of breast: Secondary | ICD-10-CM | POA: Diagnosis not present

## 2016-10-16 DIAGNOSIS — H2513 Age-related nuclear cataract, bilateral: Secondary | ICD-10-CM | POA: Diagnosis not present

## 2016-10-16 DIAGNOSIS — H04129 Dry eye syndrome of unspecified lacrimal gland: Secondary | ICD-10-CM | POA: Diagnosis not present

## 2016-12-05 DIAGNOSIS — R51 Headache: Secondary | ICD-10-CM | POA: Diagnosis not present

## 2016-12-11 DIAGNOSIS — Z23 Encounter for immunization: Secondary | ICD-10-CM | POA: Diagnosis not present

## 2016-12-11 DIAGNOSIS — I10 Essential (primary) hypertension: Secondary | ICD-10-CM | POA: Diagnosis not present

## 2016-12-11 DIAGNOSIS — Z1389 Encounter for screening for other disorder: Secondary | ICD-10-CM | POA: Diagnosis not present

## 2017-03-04 DIAGNOSIS — J209 Acute bronchitis, unspecified: Secondary | ICD-10-CM | POA: Diagnosis not present

## 2017-03-04 DIAGNOSIS — H6121 Impacted cerumen, right ear: Secondary | ICD-10-CM | POA: Diagnosis not present

## 2017-03-21 DIAGNOSIS — E042 Nontoxic multinodular goiter: Secondary | ICD-10-CM | POA: Diagnosis not present

## 2017-03-21 DIAGNOSIS — E041 Nontoxic single thyroid nodule: Secondary | ICD-10-CM | POA: Diagnosis not present

## 2017-03-25 DIAGNOSIS — D751 Secondary polycythemia: Secondary | ICD-10-CM | POA: Diagnosis not present

## 2017-04-03 DIAGNOSIS — E041 Nontoxic single thyroid nodule: Secondary | ICD-10-CM | POA: Diagnosis not present

## 2017-04-17 DIAGNOSIS — M5431 Sciatica, right side: Secondary | ICD-10-CM | POA: Diagnosis not present

## 2017-05-21 DIAGNOSIS — M7061 Trochanteric bursitis, right hip: Secondary | ICD-10-CM | POA: Diagnosis not present

## 2017-05-21 DIAGNOSIS — M25551 Pain in right hip: Secondary | ICD-10-CM | POA: Diagnosis not present

## 2017-07-21 DIAGNOSIS — M7989 Other specified soft tissue disorders: Secondary | ICD-10-CM | POA: Diagnosis not present

## 2017-07-21 DIAGNOSIS — Z0000 Encounter for general adult medical examination without abnormal findings: Secondary | ICD-10-CM | POA: Diagnosis not present

## 2017-07-24 DIAGNOSIS — M19041 Primary osteoarthritis, right hand: Secondary | ICD-10-CM | POA: Diagnosis not present

## 2017-08-04 DIAGNOSIS — R51 Headache: Secondary | ICD-10-CM | POA: Diagnosis not present

## 2017-08-04 DIAGNOSIS — H6983 Other specified disorders of Eustachian tube, bilateral: Secondary | ICD-10-CM | POA: Diagnosis not present

## 2017-08-04 DIAGNOSIS — R42 Dizziness and giddiness: Secondary | ICD-10-CM | POA: Diagnosis not present

## 2017-08-25 DIAGNOSIS — R5382 Chronic fatigue, unspecified: Secondary | ICD-10-CM | POA: Diagnosis not present

## 2017-08-25 DIAGNOSIS — M2550 Pain in unspecified joint: Secondary | ICD-10-CM | POA: Diagnosis not present

## 2017-10-02 DIAGNOSIS — E782 Mixed hyperlipidemia: Secondary | ICD-10-CM | POA: Diagnosis not present

## 2017-10-02 DIAGNOSIS — E039 Hypothyroidism, unspecified: Secondary | ICD-10-CM | POA: Diagnosis not present

## 2017-10-02 DIAGNOSIS — E119 Type 2 diabetes mellitus without complications: Secondary | ICD-10-CM | POA: Diagnosis not present

## 2017-10-17 DIAGNOSIS — Z1231 Encounter for screening mammogram for malignant neoplasm of breast: Secondary | ICD-10-CM | POA: Diagnosis not present

## 2017-10-30 DIAGNOSIS — H2513 Age-related nuclear cataract, bilateral: Secondary | ICD-10-CM | POA: Diagnosis not present

## 2017-11-26 DIAGNOSIS — M25561 Pain in right knee: Secondary | ICD-10-CM | POA: Diagnosis not present

## 2017-11-27 DIAGNOSIS — M1711 Unilateral primary osteoarthritis, right knee: Secondary | ICD-10-CM | POA: Diagnosis not present

## 2017-12-11 DIAGNOSIS — Z23 Encounter for immunization: Secondary | ICD-10-CM | POA: Diagnosis not present

## 2018-02-23 DIAGNOSIS — J329 Chronic sinusitis, unspecified: Secondary | ICD-10-CM | POA: Diagnosis not present

## 2018-02-23 DIAGNOSIS — E039 Hypothyroidism, unspecified: Secondary | ICD-10-CM | POA: Diagnosis not present

## 2018-03-05 DIAGNOSIS — M1711 Unilateral primary osteoarthritis, right knee: Secondary | ICD-10-CM | POA: Diagnosis not present

## 2018-03-05 DIAGNOSIS — M1712 Unilateral primary osteoarthritis, left knee: Secondary | ICD-10-CM | POA: Diagnosis not present

## 2018-04-21 DIAGNOSIS — E119 Type 2 diabetes mellitus without complications: Secondary | ICD-10-CM | POA: Diagnosis not present

## 2018-04-21 DIAGNOSIS — E039 Hypothyroidism, unspecified: Secondary | ICD-10-CM | POA: Diagnosis not present

## 2018-04-21 DIAGNOSIS — I10 Essential (primary) hypertension: Secondary | ICD-10-CM | POA: Diagnosis not present

## 2018-04-28 DIAGNOSIS — I10 Essential (primary) hypertension: Secondary | ICD-10-CM | POA: Diagnosis not present

## 2018-06-08 DIAGNOSIS — M1711 Unilateral primary osteoarthritis, right knee: Secondary | ICD-10-CM | POA: Diagnosis not present

## 2018-08-24 DIAGNOSIS — Z0000 Encounter for general adult medical examination without abnormal findings: Secondary | ICD-10-CM | POA: Diagnosis not present

## 2018-08-24 DIAGNOSIS — N951 Menopausal and female climacteric states: Secondary | ICD-10-CM | POA: Diagnosis not present

## 2018-08-24 DIAGNOSIS — M25562 Pain in left knee: Secondary | ICD-10-CM | POA: Diagnosis not present

## 2018-09-09 DIAGNOSIS — M1711 Unilateral primary osteoarthritis, right knee: Secondary | ICD-10-CM | POA: Diagnosis not present

## 2018-11-19 DIAGNOSIS — H2513 Age-related nuclear cataract, bilateral: Secondary | ICD-10-CM | POA: Diagnosis not present

## 2018-12-02 DIAGNOSIS — E119 Type 2 diabetes mellitus without complications: Secondary | ICD-10-CM | POA: Diagnosis not present

## 2018-12-02 DIAGNOSIS — E039 Hypothyroidism, unspecified: Secondary | ICD-10-CM | POA: Diagnosis not present

## 2018-12-16 DIAGNOSIS — Z01818 Encounter for other preprocedural examination: Secondary | ICD-10-CM | POA: Diagnosis not present

## 2018-12-16 DIAGNOSIS — M79609 Pain in unspecified limb: Secondary | ICD-10-CM | POA: Diagnosis not present

## 2018-12-16 DIAGNOSIS — Z79899 Other long term (current) drug therapy: Secondary | ICD-10-CM | POA: Diagnosis not present

## 2018-12-16 DIAGNOSIS — E559 Vitamin D deficiency, unspecified: Secondary | ICD-10-CM | POA: Diagnosis not present

## 2018-12-16 DIAGNOSIS — M1711 Unilateral primary osteoarthritis, right knee: Secondary | ICD-10-CM | POA: Diagnosis not present

## 2019-01-05 DIAGNOSIS — R928 Other abnormal and inconclusive findings on diagnostic imaging of breast: Secondary | ICD-10-CM | POA: Diagnosis not present

## 2019-01-05 DIAGNOSIS — Z1231 Encounter for screening mammogram for malignant neoplasm of breast: Secondary | ICD-10-CM | POA: Diagnosis not present

## 2019-01-06 DIAGNOSIS — A4901 Methicillin susceptible Staphylococcus aureus infection, unspecified site: Secondary | ICD-10-CM | POA: Diagnosis not present

## 2019-01-06 DIAGNOSIS — M1711 Unilateral primary osteoarthritis, right knee: Secondary | ICD-10-CM | POA: Diagnosis not present

## 2019-01-06 DIAGNOSIS — Z01818 Encounter for other preprocedural examination: Secondary | ICD-10-CM | POA: Diagnosis not present

## 2019-01-19 DIAGNOSIS — M1711 Unilateral primary osteoarthritis, right knee: Secondary | ICD-10-CM | POA: Diagnosis not present

## 2019-01-19 DIAGNOSIS — I10 Essential (primary) hypertension: Secondary | ICD-10-CM | POA: Diagnosis not present

## 2019-01-19 DIAGNOSIS — Z79899 Other long term (current) drug therapy: Secondary | ICD-10-CM | POA: Diagnosis not present

## 2019-01-19 DIAGNOSIS — G4733 Obstructive sleep apnea (adult) (pediatric): Secondary | ICD-10-CM | POA: Diagnosis not present

## 2019-01-19 DIAGNOSIS — M6588 Other synovitis and tenosynovitis, other site: Secondary | ICD-10-CM | POA: Diagnosis not present

## 2019-01-19 DIAGNOSIS — Z96651 Presence of right artificial knee joint: Secondary | ICD-10-CM | POA: Diagnosis not present

## 2019-01-19 DIAGNOSIS — G8918 Other acute postprocedural pain: Secondary | ICD-10-CM | POA: Diagnosis not present

## 2019-01-19 DIAGNOSIS — E039 Hypothyroidism, unspecified: Secondary | ICD-10-CM | POA: Diagnosis not present

## 2019-01-19 DIAGNOSIS — K219 Gastro-esophageal reflux disease without esophagitis: Secondary | ICD-10-CM | POA: Diagnosis not present

## 2019-01-19 DIAGNOSIS — J302 Other seasonal allergic rhinitis: Secondary | ICD-10-CM | POA: Diagnosis not present

## 2019-01-19 DIAGNOSIS — Z471 Aftercare following joint replacement surgery: Secondary | ICD-10-CM | POA: Diagnosis not present

## 2019-01-19 DIAGNOSIS — R001 Bradycardia, unspecified: Secondary | ICD-10-CM | POA: Diagnosis not present

## 2019-01-19 DIAGNOSIS — E785 Hyperlipidemia, unspecified: Secondary | ICD-10-CM | POA: Diagnosis not present

## 2019-01-20 DIAGNOSIS — M1711 Unilateral primary osteoarthritis, right knee: Secondary | ICD-10-CM | POA: Diagnosis not present

## 2019-01-20 DIAGNOSIS — J302 Other seasonal allergic rhinitis: Secondary | ICD-10-CM | POA: Diagnosis not present

## 2019-01-20 DIAGNOSIS — I10 Essential (primary) hypertension: Secondary | ICD-10-CM | POA: Diagnosis not present

## 2019-01-20 DIAGNOSIS — E039 Hypothyroidism, unspecified: Secondary | ICD-10-CM | POA: Diagnosis not present

## 2019-01-20 DIAGNOSIS — K219 Gastro-esophageal reflux disease without esophagitis: Secondary | ICD-10-CM | POA: Diagnosis not present

## 2019-01-20 DIAGNOSIS — G4733 Obstructive sleep apnea (adult) (pediatric): Secondary | ICD-10-CM | POA: Diagnosis not present

## 2019-01-20 DIAGNOSIS — Z79899 Other long term (current) drug therapy: Secondary | ICD-10-CM | POA: Diagnosis not present

## 2019-01-20 DIAGNOSIS — E785 Hyperlipidemia, unspecified: Secondary | ICD-10-CM | POA: Diagnosis not present

## 2019-01-20 DIAGNOSIS — R001 Bradycardia, unspecified: Secondary | ICD-10-CM | POA: Diagnosis not present

## 2019-01-21 DIAGNOSIS — Z7982 Long term (current) use of aspirin: Secondary | ICD-10-CM | POA: Diagnosis not present

## 2019-01-21 DIAGNOSIS — G4733 Obstructive sleep apnea (adult) (pediatric): Secondary | ICD-10-CM | POA: Diagnosis not present

## 2019-01-21 DIAGNOSIS — K219 Gastro-esophageal reflux disease without esophagitis: Secondary | ICD-10-CM | POA: Diagnosis not present

## 2019-01-21 DIAGNOSIS — Z79899 Other long term (current) drug therapy: Secondary | ICD-10-CM | POA: Diagnosis not present

## 2019-01-21 DIAGNOSIS — M1990 Unspecified osteoarthritis, unspecified site: Secondary | ICD-10-CM | POA: Diagnosis not present

## 2019-01-21 DIAGNOSIS — Z471 Aftercare following joint replacement surgery: Secondary | ICD-10-CM | POA: Diagnosis not present

## 2019-01-21 DIAGNOSIS — J449 Chronic obstructive pulmonary disease, unspecified: Secondary | ICD-10-CM | POA: Diagnosis not present

## 2019-01-21 DIAGNOSIS — R001 Bradycardia, unspecified: Secondary | ICD-10-CM | POA: Diagnosis not present

## 2019-01-21 DIAGNOSIS — R5381 Other malaise: Secondary | ICD-10-CM | POA: Diagnosis not present

## 2019-01-21 DIAGNOSIS — Z9181 History of falling: Secondary | ICD-10-CM | POA: Diagnosis not present

## 2019-01-21 DIAGNOSIS — J302 Other seasonal allergic rhinitis: Secondary | ICD-10-CM | POA: Diagnosis not present

## 2019-01-21 DIAGNOSIS — E785 Hyperlipidemia, unspecified: Secondary | ICD-10-CM | POA: Diagnosis not present

## 2019-01-21 DIAGNOSIS — I10 Essential (primary) hypertension: Secondary | ICD-10-CM | POA: Diagnosis not present

## 2019-01-21 DIAGNOSIS — E039 Hypothyroidism, unspecified: Secondary | ICD-10-CM | POA: Diagnosis not present

## 2019-01-21 DIAGNOSIS — Z96651 Presence of right artificial knee joint: Secondary | ICD-10-CM | POA: Diagnosis not present

## 2019-01-21 DIAGNOSIS — M6281 Muscle weakness (generalized): Secondary | ICD-10-CM | POA: Diagnosis not present

## 2019-01-21 DIAGNOSIS — Z79891 Long term (current) use of opiate analgesic: Secondary | ICD-10-CM | POA: Diagnosis not present

## 2019-01-22 DIAGNOSIS — M1990 Unspecified osteoarthritis, unspecified site: Secondary | ICD-10-CM | POA: Diagnosis not present

## 2019-01-22 DIAGNOSIS — Z79891 Long term (current) use of opiate analgesic: Secondary | ICD-10-CM | POA: Diagnosis not present

## 2019-01-22 DIAGNOSIS — I10 Essential (primary) hypertension: Secondary | ICD-10-CM | POA: Diagnosis not present

## 2019-01-22 DIAGNOSIS — E785 Hyperlipidemia, unspecified: Secondary | ICD-10-CM | POA: Diagnosis not present

## 2019-01-22 DIAGNOSIS — J449 Chronic obstructive pulmonary disease, unspecified: Secondary | ICD-10-CM | POA: Diagnosis not present

## 2019-01-22 DIAGNOSIS — R5381 Other malaise: Secondary | ICD-10-CM | POA: Diagnosis not present

## 2019-01-22 DIAGNOSIS — M6281 Muscle weakness (generalized): Secondary | ICD-10-CM | POA: Diagnosis not present

## 2019-01-22 DIAGNOSIS — Z9181 History of falling: Secondary | ICD-10-CM | POA: Diagnosis not present

## 2019-01-22 DIAGNOSIS — G4733 Obstructive sleep apnea (adult) (pediatric): Secondary | ICD-10-CM | POA: Diagnosis not present

## 2019-01-22 DIAGNOSIS — E039 Hypothyroidism, unspecified: Secondary | ICD-10-CM | POA: Diagnosis not present

## 2019-01-22 DIAGNOSIS — R001 Bradycardia, unspecified: Secondary | ICD-10-CM | POA: Diagnosis not present

## 2019-01-22 DIAGNOSIS — Z7982 Long term (current) use of aspirin: Secondary | ICD-10-CM | POA: Diagnosis not present

## 2019-01-22 DIAGNOSIS — Z79899 Other long term (current) drug therapy: Secondary | ICD-10-CM | POA: Diagnosis not present

## 2019-01-22 DIAGNOSIS — J302 Other seasonal allergic rhinitis: Secondary | ICD-10-CM | POA: Diagnosis not present

## 2019-01-22 DIAGNOSIS — Z96651 Presence of right artificial knee joint: Secondary | ICD-10-CM | POA: Diagnosis not present

## 2019-01-22 DIAGNOSIS — Z471 Aftercare following joint replacement surgery: Secondary | ICD-10-CM | POA: Diagnosis not present

## 2019-01-22 DIAGNOSIS — K219 Gastro-esophageal reflux disease without esophagitis: Secondary | ICD-10-CM | POA: Diagnosis not present

## 2019-01-25 DIAGNOSIS — Z96651 Presence of right artificial knee joint: Secondary | ICD-10-CM | POA: Diagnosis not present

## 2019-01-25 DIAGNOSIS — J449 Chronic obstructive pulmonary disease, unspecified: Secondary | ICD-10-CM | POA: Diagnosis not present

## 2019-01-25 DIAGNOSIS — R001 Bradycardia, unspecified: Secondary | ICD-10-CM | POA: Diagnosis not present

## 2019-01-25 DIAGNOSIS — I10 Essential (primary) hypertension: Secondary | ICD-10-CM | POA: Diagnosis not present

## 2019-01-25 DIAGNOSIS — M6281 Muscle weakness (generalized): Secondary | ICD-10-CM | POA: Diagnosis not present

## 2019-01-25 DIAGNOSIS — Z79891 Long term (current) use of opiate analgesic: Secondary | ICD-10-CM | POA: Diagnosis not present

## 2019-01-25 DIAGNOSIS — Z7982 Long term (current) use of aspirin: Secondary | ICD-10-CM | POA: Diagnosis not present

## 2019-01-25 DIAGNOSIS — K219 Gastro-esophageal reflux disease without esophagitis: Secondary | ICD-10-CM | POA: Diagnosis not present

## 2019-01-25 DIAGNOSIS — Z79899 Other long term (current) drug therapy: Secondary | ICD-10-CM | POA: Diagnosis not present

## 2019-01-25 DIAGNOSIS — M1990 Unspecified osteoarthritis, unspecified site: Secondary | ICD-10-CM | POA: Diagnosis not present

## 2019-01-25 DIAGNOSIS — E785 Hyperlipidemia, unspecified: Secondary | ICD-10-CM | POA: Diagnosis not present

## 2019-01-25 DIAGNOSIS — R5381 Other malaise: Secondary | ICD-10-CM | POA: Diagnosis not present

## 2019-01-25 DIAGNOSIS — E039 Hypothyroidism, unspecified: Secondary | ICD-10-CM | POA: Diagnosis not present

## 2019-01-25 DIAGNOSIS — Z471 Aftercare following joint replacement surgery: Secondary | ICD-10-CM | POA: Diagnosis not present

## 2019-01-25 DIAGNOSIS — Z9181 History of falling: Secondary | ICD-10-CM | POA: Diagnosis not present

## 2019-01-25 DIAGNOSIS — G4733 Obstructive sleep apnea (adult) (pediatric): Secondary | ICD-10-CM | POA: Diagnosis not present

## 2019-01-25 DIAGNOSIS — J302 Other seasonal allergic rhinitis: Secondary | ICD-10-CM | POA: Diagnosis not present

## 2019-01-27 DIAGNOSIS — R001 Bradycardia, unspecified: Secondary | ICD-10-CM | POA: Diagnosis not present

## 2019-01-27 DIAGNOSIS — Z96651 Presence of right artificial knee joint: Secondary | ICD-10-CM | POA: Diagnosis not present

## 2019-01-27 DIAGNOSIS — E785 Hyperlipidemia, unspecified: Secondary | ICD-10-CM | POA: Diagnosis not present

## 2019-01-27 DIAGNOSIS — R5381 Other malaise: Secondary | ICD-10-CM | POA: Diagnosis not present

## 2019-01-27 DIAGNOSIS — Z9181 History of falling: Secondary | ICD-10-CM | POA: Diagnosis not present

## 2019-01-27 DIAGNOSIS — G4733 Obstructive sleep apnea (adult) (pediatric): Secondary | ICD-10-CM | POA: Diagnosis not present

## 2019-01-27 DIAGNOSIS — I10 Essential (primary) hypertension: Secondary | ICD-10-CM | POA: Diagnosis not present

## 2019-01-27 DIAGNOSIS — J302 Other seasonal allergic rhinitis: Secondary | ICD-10-CM | POA: Diagnosis not present

## 2019-01-27 DIAGNOSIS — Z79891 Long term (current) use of opiate analgesic: Secondary | ICD-10-CM | POA: Diagnosis not present

## 2019-01-27 DIAGNOSIS — Z7982 Long term (current) use of aspirin: Secondary | ICD-10-CM | POA: Diagnosis not present

## 2019-01-27 DIAGNOSIS — K219 Gastro-esophageal reflux disease without esophagitis: Secondary | ICD-10-CM | POA: Diagnosis not present

## 2019-01-27 DIAGNOSIS — M6281 Muscle weakness (generalized): Secondary | ICD-10-CM | POA: Diagnosis not present

## 2019-01-27 DIAGNOSIS — M1990 Unspecified osteoarthritis, unspecified site: Secondary | ICD-10-CM | POA: Diagnosis not present

## 2019-01-27 DIAGNOSIS — Z471 Aftercare following joint replacement surgery: Secondary | ICD-10-CM | POA: Diagnosis not present

## 2019-01-27 DIAGNOSIS — E039 Hypothyroidism, unspecified: Secondary | ICD-10-CM | POA: Diagnosis not present

## 2019-01-27 DIAGNOSIS — J449 Chronic obstructive pulmonary disease, unspecified: Secondary | ICD-10-CM | POA: Diagnosis not present

## 2019-01-27 DIAGNOSIS — Z79899 Other long term (current) drug therapy: Secondary | ICD-10-CM | POA: Diagnosis not present

## 2019-01-29 DIAGNOSIS — M6281 Muscle weakness (generalized): Secondary | ICD-10-CM | POA: Diagnosis not present

## 2019-01-29 DIAGNOSIS — R001 Bradycardia, unspecified: Secondary | ICD-10-CM | POA: Diagnosis not present

## 2019-01-29 DIAGNOSIS — J449 Chronic obstructive pulmonary disease, unspecified: Secondary | ICD-10-CM | POA: Diagnosis not present

## 2019-01-29 DIAGNOSIS — Z9181 History of falling: Secondary | ICD-10-CM | POA: Diagnosis not present

## 2019-01-29 DIAGNOSIS — Z7982 Long term (current) use of aspirin: Secondary | ICD-10-CM | POA: Diagnosis not present

## 2019-01-29 DIAGNOSIS — G4733 Obstructive sleep apnea (adult) (pediatric): Secondary | ICD-10-CM | POA: Diagnosis not present

## 2019-01-29 DIAGNOSIS — M1990 Unspecified osteoarthritis, unspecified site: Secondary | ICD-10-CM | POA: Diagnosis not present

## 2019-01-29 DIAGNOSIS — Z79891 Long term (current) use of opiate analgesic: Secondary | ICD-10-CM | POA: Diagnosis not present

## 2019-01-29 DIAGNOSIS — J302 Other seasonal allergic rhinitis: Secondary | ICD-10-CM | POA: Diagnosis not present

## 2019-01-29 DIAGNOSIS — R5381 Other malaise: Secondary | ICD-10-CM | POA: Diagnosis not present

## 2019-01-29 DIAGNOSIS — Z79899 Other long term (current) drug therapy: Secondary | ICD-10-CM | POA: Diagnosis not present

## 2019-01-29 DIAGNOSIS — Z96651 Presence of right artificial knee joint: Secondary | ICD-10-CM | POA: Diagnosis not present

## 2019-01-29 DIAGNOSIS — E785 Hyperlipidemia, unspecified: Secondary | ICD-10-CM | POA: Diagnosis not present

## 2019-01-29 DIAGNOSIS — E039 Hypothyroidism, unspecified: Secondary | ICD-10-CM | POA: Diagnosis not present

## 2019-01-29 DIAGNOSIS — K219 Gastro-esophageal reflux disease without esophagitis: Secondary | ICD-10-CM | POA: Diagnosis not present

## 2019-01-29 DIAGNOSIS — I10 Essential (primary) hypertension: Secondary | ICD-10-CM | POA: Diagnosis not present

## 2019-01-29 DIAGNOSIS — Z471 Aftercare following joint replacement surgery: Secondary | ICD-10-CM | POA: Diagnosis not present

## 2019-02-01 DIAGNOSIS — Z96651 Presence of right artificial knee joint: Secondary | ICD-10-CM | POA: Diagnosis not present

## 2019-02-01 DIAGNOSIS — R001 Bradycardia, unspecified: Secondary | ICD-10-CM | POA: Diagnosis not present

## 2019-02-01 DIAGNOSIS — E039 Hypothyroidism, unspecified: Secondary | ICD-10-CM | POA: Diagnosis not present

## 2019-02-01 DIAGNOSIS — R5381 Other malaise: Secondary | ICD-10-CM | POA: Diagnosis not present

## 2019-02-01 DIAGNOSIS — Z7982 Long term (current) use of aspirin: Secondary | ICD-10-CM | POA: Diagnosis not present

## 2019-02-01 DIAGNOSIS — J302 Other seasonal allergic rhinitis: Secondary | ICD-10-CM | POA: Diagnosis not present

## 2019-02-01 DIAGNOSIS — I10 Essential (primary) hypertension: Secondary | ICD-10-CM | POA: Diagnosis not present

## 2019-02-01 DIAGNOSIS — Z9181 History of falling: Secondary | ICD-10-CM | POA: Diagnosis not present

## 2019-02-01 DIAGNOSIS — J449 Chronic obstructive pulmonary disease, unspecified: Secondary | ICD-10-CM | POA: Diagnosis not present

## 2019-02-01 DIAGNOSIS — Z79899 Other long term (current) drug therapy: Secondary | ICD-10-CM | POA: Diagnosis not present

## 2019-02-01 DIAGNOSIS — K219 Gastro-esophageal reflux disease without esophagitis: Secondary | ICD-10-CM | POA: Diagnosis not present

## 2019-02-01 DIAGNOSIS — M6281 Muscle weakness (generalized): Secondary | ICD-10-CM | POA: Diagnosis not present

## 2019-02-01 DIAGNOSIS — E785 Hyperlipidemia, unspecified: Secondary | ICD-10-CM | POA: Diagnosis not present

## 2019-02-01 DIAGNOSIS — M1990 Unspecified osteoarthritis, unspecified site: Secondary | ICD-10-CM | POA: Diagnosis not present

## 2019-02-01 DIAGNOSIS — Z79891 Long term (current) use of opiate analgesic: Secondary | ICD-10-CM | POA: Diagnosis not present

## 2019-02-01 DIAGNOSIS — Z471 Aftercare following joint replacement surgery: Secondary | ICD-10-CM | POA: Diagnosis not present

## 2019-02-01 DIAGNOSIS — G4733 Obstructive sleep apnea (adult) (pediatric): Secondary | ICD-10-CM | POA: Diagnosis not present

## 2019-02-02 DIAGNOSIS — R2689 Other abnormalities of gait and mobility: Secondary | ICD-10-CM | POA: Diagnosis not present

## 2019-02-02 DIAGNOSIS — M25661 Stiffness of right knee, not elsewhere classified: Secondary | ICD-10-CM | POA: Diagnosis not present

## 2019-02-02 DIAGNOSIS — M25561 Pain in right knee: Secondary | ICD-10-CM | POA: Diagnosis not present

## 2019-02-02 DIAGNOSIS — M6281 Muscle weakness (generalized): Secondary | ICD-10-CM | POA: Diagnosis not present

## 2019-02-05 DIAGNOSIS — M25561 Pain in right knee: Secondary | ICD-10-CM | POA: Diagnosis not present

## 2019-02-05 DIAGNOSIS — R2689 Other abnormalities of gait and mobility: Secondary | ICD-10-CM | POA: Diagnosis not present

## 2019-02-05 DIAGNOSIS — M25661 Stiffness of right knee, not elsewhere classified: Secondary | ICD-10-CM | POA: Diagnosis not present

## 2019-02-05 DIAGNOSIS — M6281 Muscle weakness (generalized): Secondary | ICD-10-CM | POA: Diagnosis not present

## 2019-02-08 DIAGNOSIS — R2689 Other abnormalities of gait and mobility: Secondary | ICD-10-CM | POA: Diagnosis not present

## 2019-02-08 DIAGNOSIS — M6281 Muscle weakness (generalized): Secondary | ICD-10-CM | POA: Diagnosis not present

## 2019-02-08 DIAGNOSIS — M25561 Pain in right knee: Secondary | ICD-10-CM | POA: Diagnosis not present

## 2019-02-08 DIAGNOSIS — M25661 Stiffness of right knee, not elsewhere classified: Secondary | ICD-10-CM | POA: Diagnosis not present

## 2019-02-10 DIAGNOSIS — R2689 Other abnormalities of gait and mobility: Secondary | ICD-10-CM | POA: Diagnosis not present

## 2019-02-10 DIAGNOSIS — M25561 Pain in right knee: Secondary | ICD-10-CM | POA: Diagnosis not present

## 2019-02-10 DIAGNOSIS — M25661 Stiffness of right knee, not elsewhere classified: Secondary | ICD-10-CM | POA: Diagnosis not present

## 2019-02-10 DIAGNOSIS — M6281 Muscle weakness (generalized): Secondary | ICD-10-CM | POA: Diagnosis not present

## 2019-02-19 DIAGNOSIS — M25561 Pain in right knee: Secondary | ICD-10-CM | POA: Diagnosis not present

## 2019-02-19 DIAGNOSIS — M25661 Stiffness of right knee, not elsewhere classified: Secondary | ICD-10-CM | POA: Diagnosis not present

## 2019-02-19 DIAGNOSIS — R2689 Other abnormalities of gait and mobility: Secondary | ICD-10-CM | POA: Diagnosis not present

## 2019-02-19 DIAGNOSIS — M6281 Muscle weakness (generalized): Secondary | ICD-10-CM | POA: Diagnosis not present

## 2019-02-23 DIAGNOSIS — R2689 Other abnormalities of gait and mobility: Secondary | ICD-10-CM | POA: Diagnosis not present

## 2019-02-23 DIAGNOSIS — M25561 Pain in right knee: Secondary | ICD-10-CM | POA: Diagnosis not present

## 2019-02-23 DIAGNOSIS — M6281 Muscle weakness (generalized): Secondary | ICD-10-CM | POA: Diagnosis not present

## 2019-02-23 DIAGNOSIS — M25661 Stiffness of right knee, not elsewhere classified: Secondary | ICD-10-CM | POA: Diagnosis not present

## 2019-02-26 DIAGNOSIS — M25561 Pain in right knee: Secondary | ICD-10-CM | POA: Diagnosis not present

## 2019-02-26 DIAGNOSIS — M25661 Stiffness of right knee, not elsewhere classified: Secondary | ICD-10-CM | POA: Diagnosis not present

## 2019-02-26 DIAGNOSIS — M6281 Muscle weakness (generalized): Secondary | ICD-10-CM | POA: Diagnosis not present

## 2019-02-26 DIAGNOSIS — R2689 Other abnormalities of gait and mobility: Secondary | ICD-10-CM | POA: Diagnosis not present

## 2019-03-01 DIAGNOSIS — M6281 Muscle weakness (generalized): Secondary | ICD-10-CM | POA: Diagnosis not present

## 2019-03-01 DIAGNOSIS — M25661 Stiffness of right knee, not elsewhere classified: Secondary | ICD-10-CM | POA: Diagnosis not present

## 2019-03-01 DIAGNOSIS — M25561 Pain in right knee: Secondary | ICD-10-CM | POA: Diagnosis not present

## 2019-03-01 DIAGNOSIS — R2689 Other abnormalities of gait and mobility: Secondary | ICD-10-CM | POA: Diagnosis not present

## 2019-03-04 DIAGNOSIS — M6281 Muscle weakness (generalized): Secondary | ICD-10-CM | POA: Diagnosis not present

## 2019-03-04 DIAGNOSIS — R2689 Other abnormalities of gait and mobility: Secondary | ICD-10-CM | POA: Diagnosis not present

## 2019-03-04 DIAGNOSIS — M25661 Stiffness of right knee, not elsewhere classified: Secondary | ICD-10-CM | POA: Diagnosis not present

## 2019-03-04 DIAGNOSIS — M1711 Unilateral primary osteoarthritis, right knee: Secondary | ICD-10-CM | POA: Diagnosis not present

## 2019-03-04 DIAGNOSIS — M25561 Pain in right knee: Secondary | ICD-10-CM | POA: Diagnosis not present

## 2019-03-09 DIAGNOSIS — M6281 Muscle weakness (generalized): Secondary | ICD-10-CM | POA: Diagnosis not present

## 2019-03-09 DIAGNOSIS — M25561 Pain in right knee: Secondary | ICD-10-CM | POA: Diagnosis not present

## 2019-03-09 DIAGNOSIS — M25661 Stiffness of right knee, not elsewhere classified: Secondary | ICD-10-CM | POA: Diagnosis not present

## 2019-03-09 DIAGNOSIS — R2689 Other abnormalities of gait and mobility: Secondary | ICD-10-CM | POA: Diagnosis not present

## 2019-03-18 DIAGNOSIS — M6281 Muscle weakness (generalized): Secondary | ICD-10-CM | POA: Diagnosis not present

## 2019-03-18 DIAGNOSIS — M25661 Stiffness of right knee, not elsewhere classified: Secondary | ICD-10-CM | POA: Diagnosis not present

## 2019-03-18 DIAGNOSIS — R2689 Other abnormalities of gait and mobility: Secondary | ICD-10-CM | POA: Diagnosis not present

## 2019-03-18 DIAGNOSIS — M25561 Pain in right knee: Secondary | ICD-10-CM | POA: Diagnosis not present

## 2019-03-24 DIAGNOSIS — M6281 Muscle weakness (generalized): Secondary | ICD-10-CM | POA: Diagnosis not present

## 2019-03-24 DIAGNOSIS — M25561 Pain in right knee: Secondary | ICD-10-CM | POA: Diagnosis not present

## 2019-03-24 DIAGNOSIS — R2689 Other abnormalities of gait and mobility: Secondary | ICD-10-CM | POA: Diagnosis not present

## 2019-03-24 DIAGNOSIS — M25661 Stiffness of right knee, not elsewhere classified: Secondary | ICD-10-CM | POA: Diagnosis not present

## 2019-03-26 DIAGNOSIS — M25661 Stiffness of right knee, not elsewhere classified: Secondary | ICD-10-CM | POA: Diagnosis not present

## 2019-03-26 DIAGNOSIS — M6281 Muscle weakness (generalized): Secondary | ICD-10-CM | POA: Diagnosis not present

## 2019-03-26 DIAGNOSIS — M25561 Pain in right knee: Secondary | ICD-10-CM | POA: Diagnosis not present

## 2019-03-26 DIAGNOSIS — R2689 Other abnormalities of gait and mobility: Secondary | ICD-10-CM | POA: Diagnosis not present

## 2019-03-29 DIAGNOSIS — R2689 Other abnormalities of gait and mobility: Secondary | ICD-10-CM | POA: Diagnosis not present

## 2019-03-29 DIAGNOSIS — M25661 Stiffness of right knee, not elsewhere classified: Secondary | ICD-10-CM | POA: Diagnosis not present

## 2019-03-29 DIAGNOSIS — M25561 Pain in right knee: Secondary | ICD-10-CM | POA: Diagnosis not present

## 2019-03-29 DIAGNOSIS — M6281 Muscle weakness (generalized): Secondary | ICD-10-CM | POA: Diagnosis not present

## 2019-03-31 DIAGNOSIS — M25561 Pain in right knee: Secondary | ICD-10-CM | POA: Diagnosis not present

## 2019-03-31 DIAGNOSIS — M6281 Muscle weakness (generalized): Secondary | ICD-10-CM | POA: Diagnosis not present

## 2019-03-31 DIAGNOSIS — M25661 Stiffness of right knee, not elsewhere classified: Secondary | ICD-10-CM | POA: Diagnosis not present

## 2019-03-31 DIAGNOSIS — R2689 Other abnormalities of gait and mobility: Secondary | ICD-10-CM | POA: Diagnosis not present

## 2019-04-05 DIAGNOSIS — M25661 Stiffness of right knee, not elsewhere classified: Secondary | ICD-10-CM | POA: Diagnosis not present

## 2019-04-05 DIAGNOSIS — M6281 Muscle weakness (generalized): Secondary | ICD-10-CM | POA: Diagnosis not present

## 2019-04-05 DIAGNOSIS — R2689 Other abnormalities of gait and mobility: Secondary | ICD-10-CM | POA: Diagnosis not present

## 2019-04-05 DIAGNOSIS — M25561 Pain in right knee: Secondary | ICD-10-CM | POA: Diagnosis not present

## 2019-04-07 DIAGNOSIS — M25661 Stiffness of right knee, not elsewhere classified: Secondary | ICD-10-CM | POA: Diagnosis not present

## 2019-04-07 DIAGNOSIS — M25561 Pain in right knee: Secondary | ICD-10-CM | POA: Diagnosis not present

## 2019-04-07 DIAGNOSIS — M6281 Muscle weakness (generalized): Secondary | ICD-10-CM | POA: Diagnosis not present

## 2019-04-07 DIAGNOSIS — R2689 Other abnormalities of gait and mobility: Secondary | ICD-10-CM | POA: Diagnosis not present

## 2019-04-12 DIAGNOSIS — M25661 Stiffness of right knee, not elsewhere classified: Secondary | ICD-10-CM | POA: Diagnosis not present

## 2019-04-12 DIAGNOSIS — M25561 Pain in right knee: Secondary | ICD-10-CM | POA: Diagnosis not present

## 2019-04-12 DIAGNOSIS — M6281 Muscle weakness (generalized): Secondary | ICD-10-CM | POA: Diagnosis not present

## 2019-04-12 DIAGNOSIS — R2689 Other abnormalities of gait and mobility: Secondary | ICD-10-CM | POA: Diagnosis not present

## 2019-04-14 DIAGNOSIS — R2689 Other abnormalities of gait and mobility: Secondary | ICD-10-CM | POA: Diagnosis not present

## 2019-04-14 DIAGNOSIS — M25661 Stiffness of right knee, not elsewhere classified: Secondary | ICD-10-CM | POA: Diagnosis not present

## 2019-04-14 DIAGNOSIS — M25561 Pain in right knee: Secondary | ICD-10-CM | POA: Diagnosis not present

## 2019-04-14 DIAGNOSIS — M6281 Muscle weakness (generalized): Secondary | ICD-10-CM | POA: Diagnosis not present

## 2019-05-13 DIAGNOSIS — S61213A Laceration without foreign body of left middle finger without damage to nail, initial encounter: Secondary | ICD-10-CM | POA: Diagnosis not present

## 2019-05-13 DIAGNOSIS — S67193A Crushing injury of left middle finger, initial encounter: Secondary | ICD-10-CM | POA: Diagnosis not present

## 2019-05-24 DIAGNOSIS — E039 Hypothyroidism, unspecified: Secondary | ICD-10-CM | POA: Diagnosis not present

## 2019-05-24 DIAGNOSIS — E119 Type 2 diabetes mellitus without complications: Secondary | ICD-10-CM | POA: Diagnosis not present

## 2019-05-24 DIAGNOSIS — Z0000 Encounter for general adult medical examination without abnormal findings: Secondary | ICD-10-CM | POA: Diagnosis not present

## 2019-06-09 DIAGNOSIS — S52001A Unspecified fracture of upper end of right ulna, initial encounter for closed fracture: Secondary | ICD-10-CM | POA: Diagnosis not present

## 2019-06-09 DIAGNOSIS — M25521 Pain in right elbow: Secondary | ICD-10-CM | POA: Diagnosis not present

## 2019-06-09 DIAGNOSIS — S5001XA Contusion of right elbow, initial encounter: Secondary | ICD-10-CM | POA: Diagnosis not present

## 2019-06-17 DIAGNOSIS — M1712 Unilateral primary osteoarthritis, left knee: Secondary | ICD-10-CM | POA: Diagnosis not present

## 2019-06-21 DIAGNOSIS — R52 Pain, unspecified: Secondary | ICD-10-CM | POA: Diagnosis not present

## 2019-06-21 DIAGNOSIS — E559 Vitamin D deficiency, unspecified: Secondary | ICD-10-CM | POA: Diagnosis not present

## 2019-06-21 DIAGNOSIS — Z79899 Other long term (current) drug therapy: Secondary | ICD-10-CM | POA: Diagnosis not present

## 2019-06-21 DIAGNOSIS — Z01818 Encounter for other preprocedural examination: Secondary | ICD-10-CM | POA: Diagnosis not present

## 2019-06-21 DIAGNOSIS — M79609 Pain in unspecified limb: Secondary | ICD-10-CM | POA: Diagnosis not present

## 2019-07-07 DIAGNOSIS — Z1159 Encounter for screening for other viral diseases: Secondary | ICD-10-CM | POA: Diagnosis not present

## 2019-07-13 DIAGNOSIS — E785 Hyperlipidemia, unspecified: Secondary | ICD-10-CM | POA: Diagnosis not present

## 2019-07-13 DIAGNOSIS — I10 Essential (primary) hypertension: Secondary | ICD-10-CM | POA: Diagnosis not present

## 2019-07-13 DIAGNOSIS — Z96652 Presence of left artificial knee joint: Secondary | ICD-10-CM | POA: Diagnosis not present

## 2019-07-13 DIAGNOSIS — G8918 Other acute postprocedural pain: Secondary | ICD-10-CM | POA: Diagnosis not present

## 2019-07-13 DIAGNOSIS — Z471 Aftercare following joint replacement surgery: Secondary | ICD-10-CM | POA: Diagnosis not present

## 2019-07-13 DIAGNOSIS — J45909 Unspecified asthma, uncomplicated: Secondary | ICD-10-CM | POA: Diagnosis not present

## 2019-07-13 DIAGNOSIS — M1712 Unilateral primary osteoarthritis, left knee: Secondary | ICD-10-CM | POA: Diagnosis not present

## 2019-07-13 DIAGNOSIS — E039 Hypothyroidism, unspecified: Secondary | ICD-10-CM | POA: Diagnosis not present

## 2019-07-13 DIAGNOSIS — G4733 Obstructive sleep apnea (adult) (pediatric): Secondary | ICD-10-CM | POA: Diagnosis not present

## 2019-07-13 DIAGNOSIS — Z79891 Long term (current) use of opiate analgesic: Secondary | ICD-10-CM | POA: Diagnosis not present

## 2019-07-13 DIAGNOSIS — Z79899 Other long term (current) drug therapy: Secondary | ICD-10-CM | POA: Diagnosis not present

## 2019-07-13 DIAGNOSIS — K219 Gastro-esophageal reflux disease without esophagitis: Secondary | ICD-10-CM | POA: Diagnosis not present

## 2019-07-14 DIAGNOSIS — E785 Hyperlipidemia, unspecified: Secondary | ICD-10-CM | POA: Diagnosis not present

## 2019-07-14 DIAGNOSIS — J45909 Unspecified asthma, uncomplicated: Secondary | ICD-10-CM | POA: Diagnosis not present

## 2019-07-14 DIAGNOSIS — Z79891 Long term (current) use of opiate analgesic: Secondary | ICD-10-CM | POA: Diagnosis not present

## 2019-07-14 DIAGNOSIS — G4733 Obstructive sleep apnea (adult) (pediatric): Secondary | ICD-10-CM | POA: Diagnosis not present

## 2019-07-14 DIAGNOSIS — E039 Hypothyroidism, unspecified: Secondary | ICD-10-CM | POA: Diagnosis not present

## 2019-07-14 DIAGNOSIS — I10 Essential (primary) hypertension: Secondary | ICD-10-CM | POA: Diagnosis not present

## 2019-07-14 DIAGNOSIS — K219 Gastro-esophageal reflux disease without esophagitis: Secondary | ICD-10-CM | POA: Diagnosis not present

## 2019-07-14 DIAGNOSIS — M1712 Unilateral primary osteoarthritis, left knee: Secondary | ICD-10-CM | POA: Diagnosis not present

## 2019-07-14 DIAGNOSIS — Z79899 Other long term (current) drug therapy: Secondary | ICD-10-CM | POA: Diagnosis not present

## 2019-07-15 DIAGNOSIS — E039 Hypothyroidism, unspecified: Secondary | ICD-10-CM | POA: Diagnosis not present

## 2019-07-15 DIAGNOSIS — G501 Atypical facial pain: Secondary | ICD-10-CM | POA: Diagnosis not present

## 2019-07-15 DIAGNOSIS — Z96653 Presence of artificial knee joint, bilateral: Secondary | ICD-10-CM | POA: Diagnosis not present

## 2019-07-15 DIAGNOSIS — Z471 Aftercare following joint replacement surgery: Secondary | ICD-10-CM | POA: Diagnosis not present

## 2019-07-15 DIAGNOSIS — J309 Allergic rhinitis, unspecified: Secondary | ICD-10-CM | POA: Diagnosis not present

## 2019-07-15 DIAGNOSIS — M19041 Primary osteoarthritis, right hand: Secondary | ICD-10-CM | POA: Diagnosis not present

## 2019-07-15 DIAGNOSIS — H9190 Unspecified hearing loss, unspecified ear: Secondary | ICD-10-CM | POA: Diagnosis not present

## 2019-07-15 DIAGNOSIS — J449 Chronic obstructive pulmonary disease, unspecified: Secondary | ICD-10-CM | POA: Diagnosis not present

## 2019-07-15 DIAGNOSIS — Z7982 Long term (current) use of aspirin: Secondary | ICD-10-CM | POA: Diagnosis not present

## 2019-07-15 DIAGNOSIS — E7800 Pure hypercholesterolemia, unspecified: Secondary | ICD-10-CM | POA: Diagnosis not present

## 2019-07-15 DIAGNOSIS — I10 Essential (primary) hypertension: Secondary | ICD-10-CM | POA: Diagnosis not present

## 2019-07-15 DIAGNOSIS — H8111 Benign paroxysmal vertigo, right ear: Secondary | ICD-10-CM | POA: Diagnosis not present

## 2019-07-16 DIAGNOSIS — E039 Hypothyroidism, unspecified: Secondary | ICD-10-CM | POA: Diagnosis not present

## 2019-07-16 DIAGNOSIS — Z471 Aftercare following joint replacement surgery: Secondary | ICD-10-CM | POA: Diagnosis not present

## 2019-07-16 DIAGNOSIS — M19041 Primary osteoarthritis, right hand: Secondary | ICD-10-CM | POA: Diagnosis not present

## 2019-07-16 DIAGNOSIS — Z7982 Long term (current) use of aspirin: Secondary | ICD-10-CM | POA: Diagnosis not present

## 2019-07-16 DIAGNOSIS — J449 Chronic obstructive pulmonary disease, unspecified: Secondary | ICD-10-CM | POA: Diagnosis not present

## 2019-07-16 DIAGNOSIS — H8111 Benign paroxysmal vertigo, right ear: Secondary | ICD-10-CM | POA: Diagnosis not present

## 2019-07-16 DIAGNOSIS — E7800 Pure hypercholesterolemia, unspecified: Secondary | ICD-10-CM | POA: Diagnosis not present

## 2019-07-16 DIAGNOSIS — Z96653 Presence of artificial knee joint, bilateral: Secondary | ICD-10-CM | POA: Diagnosis not present

## 2019-07-16 DIAGNOSIS — J309 Allergic rhinitis, unspecified: Secondary | ICD-10-CM | POA: Diagnosis not present

## 2019-07-16 DIAGNOSIS — I10 Essential (primary) hypertension: Secondary | ICD-10-CM | POA: Diagnosis not present

## 2019-07-16 DIAGNOSIS — G501 Atypical facial pain: Secondary | ICD-10-CM | POA: Diagnosis not present

## 2019-07-16 DIAGNOSIS — H9190 Unspecified hearing loss, unspecified ear: Secondary | ICD-10-CM | POA: Diagnosis not present

## 2019-07-17 DIAGNOSIS — Z7982 Long term (current) use of aspirin: Secondary | ICD-10-CM | POA: Diagnosis not present

## 2019-07-17 DIAGNOSIS — M19041 Primary osteoarthritis, right hand: Secondary | ICD-10-CM | POA: Diagnosis not present

## 2019-07-17 DIAGNOSIS — J309 Allergic rhinitis, unspecified: Secondary | ICD-10-CM | POA: Diagnosis not present

## 2019-07-17 DIAGNOSIS — Z96653 Presence of artificial knee joint, bilateral: Secondary | ICD-10-CM | POA: Diagnosis not present

## 2019-07-17 DIAGNOSIS — G501 Atypical facial pain: Secondary | ICD-10-CM | POA: Diagnosis not present

## 2019-07-17 DIAGNOSIS — I10 Essential (primary) hypertension: Secondary | ICD-10-CM | POA: Diagnosis not present

## 2019-07-17 DIAGNOSIS — H8111 Benign paroxysmal vertigo, right ear: Secondary | ICD-10-CM | POA: Diagnosis not present

## 2019-07-17 DIAGNOSIS — H9190 Unspecified hearing loss, unspecified ear: Secondary | ICD-10-CM | POA: Diagnosis not present

## 2019-07-17 DIAGNOSIS — E039 Hypothyroidism, unspecified: Secondary | ICD-10-CM | POA: Diagnosis not present

## 2019-07-17 DIAGNOSIS — J449 Chronic obstructive pulmonary disease, unspecified: Secondary | ICD-10-CM | POA: Diagnosis not present

## 2019-07-17 DIAGNOSIS — E7800 Pure hypercholesterolemia, unspecified: Secondary | ICD-10-CM | POA: Diagnosis not present

## 2019-07-17 DIAGNOSIS — Z471 Aftercare following joint replacement surgery: Secondary | ICD-10-CM | POA: Diagnosis not present

## 2019-07-19 DIAGNOSIS — J309 Allergic rhinitis, unspecified: Secondary | ICD-10-CM | POA: Diagnosis not present

## 2019-07-19 DIAGNOSIS — M19041 Primary osteoarthritis, right hand: Secondary | ICD-10-CM | POA: Diagnosis not present

## 2019-07-19 DIAGNOSIS — I10 Essential (primary) hypertension: Secondary | ICD-10-CM | POA: Diagnosis not present

## 2019-07-19 DIAGNOSIS — Z7982 Long term (current) use of aspirin: Secondary | ICD-10-CM | POA: Diagnosis not present

## 2019-07-19 DIAGNOSIS — H9190 Unspecified hearing loss, unspecified ear: Secondary | ICD-10-CM | POA: Diagnosis not present

## 2019-07-19 DIAGNOSIS — E7800 Pure hypercholesterolemia, unspecified: Secondary | ICD-10-CM | POA: Diagnosis not present

## 2019-07-19 DIAGNOSIS — G501 Atypical facial pain: Secondary | ICD-10-CM | POA: Diagnosis not present

## 2019-07-19 DIAGNOSIS — E039 Hypothyroidism, unspecified: Secondary | ICD-10-CM | POA: Diagnosis not present

## 2019-07-19 DIAGNOSIS — H8111 Benign paroxysmal vertigo, right ear: Secondary | ICD-10-CM | POA: Diagnosis not present

## 2019-07-19 DIAGNOSIS — Z96653 Presence of artificial knee joint, bilateral: Secondary | ICD-10-CM | POA: Diagnosis not present

## 2019-07-19 DIAGNOSIS — J449 Chronic obstructive pulmonary disease, unspecified: Secondary | ICD-10-CM | POA: Diagnosis not present

## 2019-07-19 DIAGNOSIS — Z471 Aftercare following joint replacement surgery: Secondary | ICD-10-CM | POA: Diagnosis not present

## 2019-07-20 DIAGNOSIS — J449 Chronic obstructive pulmonary disease, unspecified: Secondary | ICD-10-CM | POA: Diagnosis not present

## 2019-07-20 DIAGNOSIS — M19041 Primary osteoarthritis, right hand: Secondary | ICD-10-CM | POA: Diagnosis not present

## 2019-07-20 DIAGNOSIS — J309 Allergic rhinitis, unspecified: Secondary | ICD-10-CM | POA: Diagnosis not present

## 2019-07-20 DIAGNOSIS — G501 Atypical facial pain: Secondary | ICD-10-CM | POA: Diagnosis not present

## 2019-07-20 DIAGNOSIS — Z471 Aftercare following joint replacement surgery: Secondary | ICD-10-CM | POA: Diagnosis not present

## 2019-07-20 DIAGNOSIS — H8111 Benign paroxysmal vertigo, right ear: Secondary | ICD-10-CM | POA: Diagnosis not present

## 2019-07-20 DIAGNOSIS — H9190 Unspecified hearing loss, unspecified ear: Secondary | ICD-10-CM | POA: Diagnosis not present

## 2019-07-20 DIAGNOSIS — Z7982 Long term (current) use of aspirin: Secondary | ICD-10-CM | POA: Diagnosis not present

## 2019-07-20 DIAGNOSIS — Z96653 Presence of artificial knee joint, bilateral: Secondary | ICD-10-CM | POA: Diagnosis not present

## 2019-07-20 DIAGNOSIS — I10 Essential (primary) hypertension: Secondary | ICD-10-CM | POA: Diagnosis not present

## 2019-07-20 DIAGNOSIS — E039 Hypothyroidism, unspecified: Secondary | ICD-10-CM | POA: Diagnosis not present

## 2019-07-20 DIAGNOSIS — E7800 Pure hypercholesterolemia, unspecified: Secondary | ICD-10-CM | POA: Diagnosis not present

## 2019-07-22 DIAGNOSIS — E7800 Pure hypercholesterolemia, unspecified: Secondary | ICD-10-CM | POA: Diagnosis not present

## 2019-07-22 DIAGNOSIS — E039 Hypothyroidism, unspecified: Secondary | ICD-10-CM | POA: Diagnosis not present

## 2019-07-22 DIAGNOSIS — G501 Atypical facial pain: Secondary | ICD-10-CM | POA: Diagnosis not present

## 2019-07-22 DIAGNOSIS — I10 Essential (primary) hypertension: Secondary | ICD-10-CM | POA: Diagnosis not present

## 2019-07-22 DIAGNOSIS — H9190 Unspecified hearing loss, unspecified ear: Secondary | ICD-10-CM | POA: Diagnosis not present

## 2019-07-22 DIAGNOSIS — J449 Chronic obstructive pulmonary disease, unspecified: Secondary | ICD-10-CM | POA: Diagnosis not present

## 2019-07-22 DIAGNOSIS — Z7982 Long term (current) use of aspirin: Secondary | ICD-10-CM | POA: Diagnosis not present

## 2019-07-22 DIAGNOSIS — Z96653 Presence of artificial knee joint, bilateral: Secondary | ICD-10-CM | POA: Diagnosis not present

## 2019-07-22 DIAGNOSIS — H8111 Benign paroxysmal vertigo, right ear: Secondary | ICD-10-CM | POA: Diagnosis not present

## 2019-07-22 DIAGNOSIS — Z471 Aftercare following joint replacement surgery: Secondary | ICD-10-CM | POA: Diagnosis not present

## 2019-07-22 DIAGNOSIS — J309 Allergic rhinitis, unspecified: Secondary | ICD-10-CM | POA: Diagnosis not present

## 2019-07-22 DIAGNOSIS — M19041 Primary osteoarthritis, right hand: Secondary | ICD-10-CM | POA: Diagnosis not present

## 2019-07-23 DIAGNOSIS — Z471 Aftercare following joint replacement surgery: Secondary | ICD-10-CM | POA: Diagnosis not present

## 2019-07-23 DIAGNOSIS — Z96653 Presence of artificial knee joint, bilateral: Secondary | ICD-10-CM | POA: Diagnosis not present

## 2019-07-23 DIAGNOSIS — H9190 Unspecified hearing loss, unspecified ear: Secondary | ICD-10-CM | POA: Diagnosis not present

## 2019-07-23 DIAGNOSIS — E039 Hypothyroidism, unspecified: Secondary | ICD-10-CM | POA: Diagnosis not present

## 2019-07-23 DIAGNOSIS — Z7982 Long term (current) use of aspirin: Secondary | ICD-10-CM | POA: Diagnosis not present

## 2019-07-23 DIAGNOSIS — H8111 Benign paroxysmal vertigo, right ear: Secondary | ICD-10-CM | POA: Diagnosis not present

## 2019-07-23 DIAGNOSIS — E7800 Pure hypercholesterolemia, unspecified: Secondary | ICD-10-CM | POA: Diagnosis not present

## 2019-07-23 DIAGNOSIS — J309 Allergic rhinitis, unspecified: Secondary | ICD-10-CM | POA: Diagnosis not present

## 2019-07-23 DIAGNOSIS — M19041 Primary osteoarthritis, right hand: Secondary | ICD-10-CM | POA: Diagnosis not present

## 2019-07-23 DIAGNOSIS — I10 Essential (primary) hypertension: Secondary | ICD-10-CM | POA: Diagnosis not present

## 2019-07-23 DIAGNOSIS — J449 Chronic obstructive pulmonary disease, unspecified: Secondary | ICD-10-CM | POA: Diagnosis not present

## 2019-07-23 DIAGNOSIS — G501 Atypical facial pain: Secondary | ICD-10-CM | POA: Diagnosis not present

## 2019-07-27 DIAGNOSIS — M25562 Pain in left knee: Secondary | ICD-10-CM | POA: Diagnosis not present

## 2019-07-27 DIAGNOSIS — M6281 Muscle weakness (generalized): Secondary | ICD-10-CM | POA: Diagnosis not present

## 2019-07-27 DIAGNOSIS — M25662 Stiffness of left knee, not elsewhere classified: Secondary | ICD-10-CM | POA: Diagnosis not present

## 2019-07-27 DIAGNOSIS — Z96652 Presence of left artificial knee joint: Secondary | ICD-10-CM | POA: Diagnosis not present

## 2019-07-30 DIAGNOSIS — M25662 Stiffness of left knee, not elsewhere classified: Secondary | ICD-10-CM | POA: Diagnosis not present

## 2019-07-30 DIAGNOSIS — Z96652 Presence of left artificial knee joint: Secondary | ICD-10-CM | POA: Diagnosis not present

## 2019-07-30 DIAGNOSIS — M25562 Pain in left knee: Secondary | ICD-10-CM | POA: Diagnosis not present

## 2019-07-30 DIAGNOSIS — M6281 Muscle weakness (generalized): Secondary | ICD-10-CM | POA: Diagnosis not present

## 2019-08-02 DIAGNOSIS — Z96652 Presence of left artificial knee joint: Secondary | ICD-10-CM | POA: Diagnosis not present

## 2019-08-02 DIAGNOSIS — M25562 Pain in left knee: Secondary | ICD-10-CM | POA: Diagnosis not present

## 2019-08-02 DIAGNOSIS — M6281 Muscle weakness (generalized): Secondary | ICD-10-CM | POA: Diagnosis not present

## 2019-08-02 DIAGNOSIS — M25662 Stiffness of left knee, not elsewhere classified: Secondary | ICD-10-CM | POA: Diagnosis not present

## 2019-08-04 DIAGNOSIS — M25662 Stiffness of left knee, not elsewhere classified: Secondary | ICD-10-CM | POA: Diagnosis not present

## 2019-08-04 DIAGNOSIS — M25562 Pain in left knee: Secondary | ICD-10-CM | POA: Diagnosis not present

## 2019-08-04 DIAGNOSIS — Z96652 Presence of left artificial knee joint: Secondary | ICD-10-CM | POA: Diagnosis not present

## 2019-08-04 DIAGNOSIS — M6281 Muscle weakness (generalized): Secondary | ICD-10-CM | POA: Diagnosis not present

## 2019-08-09 DIAGNOSIS — M6281 Muscle weakness (generalized): Secondary | ICD-10-CM | POA: Diagnosis not present

## 2019-08-09 DIAGNOSIS — M25662 Stiffness of left knee, not elsewhere classified: Secondary | ICD-10-CM | POA: Diagnosis not present

## 2019-08-09 DIAGNOSIS — M25562 Pain in left knee: Secondary | ICD-10-CM | POA: Diagnosis not present

## 2019-08-09 DIAGNOSIS — Z96652 Presence of left artificial knee joint: Secondary | ICD-10-CM | POA: Diagnosis not present

## 2019-08-11 DIAGNOSIS — M6281 Muscle weakness (generalized): Secondary | ICD-10-CM | POA: Diagnosis not present

## 2019-08-11 DIAGNOSIS — Z96652 Presence of left artificial knee joint: Secondary | ICD-10-CM | POA: Diagnosis not present

## 2019-08-11 DIAGNOSIS — M25562 Pain in left knee: Secondary | ICD-10-CM | POA: Diagnosis not present

## 2019-08-11 DIAGNOSIS — M25662 Stiffness of left knee, not elsewhere classified: Secondary | ICD-10-CM | POA: Diagnosis not present

## 2019-08-18 DIAGNOSIS — M6281 Muscle weakness (generalized): Secondary | ICD-10-CM | POA: Diagnosis not present

## 2019-08-18 DIAGNOSIS — Z96652 Presence of left artificial knee joint: Secondary | ICD-10-CM | POA: Diagnosis not present

## 2019-08-18 DIAGNOSIS — M25662 Stiffness of left knee, not elsewhere classified: Secondary | ICD-10-CM | POA: Diagnosis not present

## 2019-08-18 DIAGNOSIS — M25562 Pain in left knee: Secondary | ICD-10-CM | POA: Diagnosis not present

## 2019-08-20 DIAGNOSIS — Z96652 Presence of left artificial knee joint: Secondary | ICD-10-CM | POA: Diagnosis not present

## 2019-08-20 DIAGNOSIS — M25562 Pain in left knee: Secondary | ICD-10-CM | POA: Diagnosis not present

## 2019-08-20 DIAGNOSIS — M25662 Stiffness of left knee, not elsewhere classified: Secondary | ICD-10-CM | POA: Diagnosis not present

## 2019-08-20 DIAGNOSIS — M6281 Muscle weakness (generalized): Secondary | ICD-10-CM | POA: Diagnosis not present

## 2019-08-23 DIAGNOSIS — Z96652 Presence of left artificial knee joint: Secondary | ICD-10-CM | POA: Diagnosis not present

## 2019-08-23 DIAGNOSIS — M25562 Pain in left knee: Secondary | ICD-10-CM | POA: Diagnosis not present

## 2019-08-23 DIAGNOSIS — M25662 Stiffness of left knee, not elsewhere classified: Secondary | ICD-10-CM | POA: Diagnosis not present

## 2019-08-23 DIAGNOSIS — M6281 Muscle weakness (generalized): Secondary | ICD-10-CM | POA: Diagnosis not present

## 2019-08-25 DIAGNOSIS — M25662 Stiffness of left knee, not elsewhere classified: Secondary | ICD-10-CM | POA: Diagnosis not present

## 2019-08-25 DIAGNOSIS — M6281 Muscle weakness (generalized): Secondary | ICD-10-CM | POA: Diagnosis not present

## 2019-08-25 DIAGNOSIS — M25562 Pain in left knee: Secondary | ICD-10-CM | POA: Diagnosis not present

## 2019-08-25 DIAGNOSIS — Z96652 Presence of left artificial knee joint: Secondary | ICD-10-CM | POA: Diagnosis not present

## 2019-08-30 DIAGNOSIS — M25662 Stiffness of left knee, not elsewhere classified: Secondary | ICD-10-CM | POA: Diagnosis not present

## 2019-08-30 DIAGNOSIS — Z96652 Presence of left artificial knee joint: Secondary | ICD-10-CM | POA: Diagnosis not present

## 2019-08-30 DIAGNOSIS — M6281 Muscle weakness (generalized): Secondary | ICD-10-CM | POA: Diagnosis not present

## 2019-08-30 DIAGNOSIS — M25562 Pain in left knee: Secondary | ICD-10-CM | POA: Diagnosis not present

## 2019-09-02 DIAGNOSIS — M6281 Muscle weakness (generalized): Secondary | ICD-10-CM | POA: Diagnosis not present

## 2019-09-02 DIAGNOSIS — Z96652 Presence of left artificial knee joint: Secondary | ICD-10-CM | POA: Diagnosis not present

## 2019-09-02 DIAGNOSIS — M25662 Stiffness of left knee, not elsewhere classified: Secondary | ICD-10-CM | POA: Diagnosis not present

## 2019-09-02 DIAGNOSIS — M25562 Pain in left knee: Secondary | ICD-10-CM | POA: Diagnosis not present

## 2019-09-06 DIAGNOSIS — Z96652 Presence of left artificial knee joint: Secondary | ICD-10-CM | POA: Diagnosis not present

## 2019-09-06 DIAGNOSIS — M6281 Muscle weakness (generalized): Secondary | ICD-10-CM | POA: Diagnosis not present

## 2019-09-06 DIAGNOSIS — M25662 Stiffness of left knee, not elsewhere classified: Secondary | ICD-10-CM | POA: Diagnosis not present

## 2019-09-06 DIAGNOSIS — M25562 Pain in left knee: Secondary | ICD-10-CM | POA: Diagnosis not present

## 2019-09-08 DIAGNOSIS — Z96652 Presence of left artificial knee joint: Secondary | ICD-10-CM | POA: Diagnosis not present

## 2019-09-08 DIAGNOSIS — M6281 Muscle weakness (generalized): Secondary | ICD-10-CM | POA: Diagnosis not present

## 2019-09-08 DIAGNOSIS — M25562 Pain in left knee: Secondary | ICD-10-CM | POA: Diagnosis not present

## 2019-09-08 DIAGNOSIS — M25662 Stiffness of left knee, not elsewhere classified: Secondary | ICD-10-CM | POA: Diagnosis not present

## 2019-09-13 DIAGNOSIS — M6281 Muscle weakness (generalized): Secondary | ICD-10-CM | POA: Diagnosis not present

## 2019-09-13 DIAGNOSIS — Z96652 Presence of left artificial knee joint: Secondary | ICD-10-CM | POA: Diagnosis not present

## 2019-09-13 DIAGNOSIS — M25662 Stiffness of left knee, not elsewhere classified: Secondary | ICD-10-CM | POA: Diagnosis not present

## 2019-09-13 DIAGNOSIS — M25562 Pain in left knee: Secondary | ICD-10-CM | POA: Diagnosis not present

## 2019-09-15 DIAGNOSIS — M25562 Pain in left knee: Secondary | ICD-10-CM | POA: Diagnosis not present

## 2019-09-15 DIAGNOSIS — E119 Type 2 diabetes mellitus without complications: Secondary | ICD-10-CM | POA: Diagnosis not present

## 2019-09-15 DIAGNOSIS — M6281 Muscle weakness (generalized): Secondary | ICD-10-CM | POA: Diagnosis not present

## 2019-09-15 DIAGNOSIS — Z96652 Presence of left artificial knee joint: Secondary | ICD-10-CM | POA: Diagnosis not present

## 2019-09-15 DIAGNOSIS — E785 Hyperlipidemia, unspecified: Secondary | ICD-10-CM | POA: Diagnosis not present

## 2019-09-15 DIAGNOSIS — M25662 Stiffness of left knee, not elsewhere classified: Secondary | ICD-10-CM | POA: Diagnosis not present

## 2019-09-21 DIAGNOSIS — M25562 Pain in left knee: Secondary | ICD-10-CM | POA: Diagnosis not present

## 2019-09-21 DIAGNOSIS — Z96652 Presence of left artificial knee joint: Secondary | ICD-10-CM | POA: Diagnosis not present

## 2019-09-21 DIAGNOSIS — M6281 Muscle weakness (generalized): Secondary | ICD-10-CM | POA: Diagnosis not present

## 2019-09-21 DIAGNOSIS — M25662 Stiffness of left knee, not elsewhere classified: Secondary | ICD-10-CM | POA: Diagnosis not present

## 2019-09-24 DIAGNOSIS — M25562 Pain in left knee: Secondary | ICD-10-CM | POA: Diagnosis not present

## 2019-09-24 DIAGNOSIS — M6281 Muscle weakness (generalized): Secondary | ICD-10-CM | POA: Diagnosis not present

## 2019-09-24 DIAGNOSIS — M25662 Stiffness of left knee, not elsewhere classified: Secondary | ICD-10-CM | POA: Diagnosis not present

## 2019-09-24 DIAGNOSIS — Z96652 Presence of left artificial knee joint: Secondary | ICD-10-CM | POA: Diagnosis not present

## 2019-09-27 DIAGNOSIS — M25662 Stiffness of left knee, not elsewhere classified: Secondary | ICD-10-CM | POA: Diagnosis not present

## 2019-09-27 DIAGNOSIS — M6281 Muscle weakness (generalized): Secondary | ICD-10-CM | POA: Diagnosis not present

## 2019-09-27 DIAGNOSIS — Z96652 Presence of left artificial knee joint: Secondary | ICD-10-CM | POA: Diagnosis not present

## 2019-09-27 DIAGNOSIS — M25562 Pain in left knee: Secondary | ICD-10-CM | POA: Diagnosis not present

## 2019-09-29 DIAGNOSIS — Z96652 Presence of left artificial knee joint: Secondary | ICD-10-CM | POA: Diagnosis not present

## 2019-09-29 DIAGNOSIS — M6281 Muscle weakness (generalized): Secondary | ICD-10-CM | POA: Diagnosis not present

## 2019-09-29 DIAGNOSIS — M25562 Pain in left knee: Secondary | ICD-10-CM | POA: Diagnosis not present

## 2019-09-29 DIAGNOSIS — M25662 Stiffness of left knee, not elsewhere classified: Secondary | ICD-10-CM | POA: Diagnosis not present

## 2019-11-05 DIAGNOSIS — E785 Hyperlipidemia, unspecified: Secondary | ICD-10-CM | POA: Diagnosis not present

## 2019-11-05 DIAGNOSIS — E039 Hypothyroidism, unspecified: Secondary | ICD-10-CM | POA: Diagnosis not present

## 2019-11-05 DIAGNOSIS — H6121 Impacted cerumen, right ear: Secondary | ICD-10-CM | POA: Diagnosis not present

## 2019-11-05 DIAGNOSIS — I10 Essential (primary) hypertension: Secondary | ICD-10-CM | POA: Diagnosis not present

## 2019-11-05 DIAGNOSIS — E119 Type 2 diabetes mellitus without complications: Secondary | ICD-10-CM | POA: Diagnosis not present

## 2019-11-12 DIAGNOSIS — R922 Inconclusive mammogram: Secondary | ICD-10-CM | POA: Diagnosis not present

## 2019-11-12 DIAGNOSIS — R928 Other abnormal and inconclusive findings on diagnostic imaging of breast: Secondary | ICD-10-CM | POA: Diagnosis not present

## 2019-11-19 DIAGNOSIS — H2513 Age-related nuclear cataract, bilateral: Secondary | ICD-10-CM | POA: Diagnosis not present

## 2019-11-29 DIAGNOSIS — D751 Secondary polycythemia: Secondary | ICD-10-CM | POA: Diagnosis not present

## 2019-11-29 DIAGNOSIS — D45 Polycythemia vera: Secondary | ICD-10-CM | POA: Diagnosis not present

## 2020-02-02 DIAGNOSIS — M1712 Unilateral primary osteoarthritis, left knee: Secondary | ICD-10-CM | POA: Diagnosis not present

## 2020-02-02 DIAGNOSIS — Z96651 Presence of right artificial knee joint: Secondary | ICD-10-CM | POA: Diagnosis not present

## 2020-03-16 DIAGNOSIS — E785 Hyperlipidemia, unspecified: Secondary | ICD-10-CM | POA: Diagnosis not present

## 2020-03-16 DIAGNOSIS — I10 Essential (primary) hypertension: Secondary | ICD-10-CM | POA: Diagnosis not present

## 2020-03-16 DIAGNOSIS — E119 Type 2 diabetes mellitus without complications: Secondary | ICD-10-CM | POA: Diagnosis not present

## 2020-04-04 DIAGNOSIS — J029 Acute pharyngitis, unspecified: Secondary | ICD-10-CM | POA: Diagnosis not present

## 2020-04-04 DIAGNOSIS — J0100 Acute maxillary sinusitis, unspecified: Secondary | ICD-10-CM | POA: Diagnosis not present

## 2020-05-03 DIAGNOSIS — R062 Wheezing: Secondary | ICD-10-CM | POA: Diagnosis not present

## 2020-05-03 DIAGNOSIS — J0100 Acute maxillary sinusitis, unspecified: Secondary | ICD-10-CM | POA: Diagnosis not present

## 2020-05-03 DIAGNOSIS — R051 Acute cough: Secondary | ICD-10-CM | POA: Diagnosis not present

## 2020-05-03 DIAGNOSIS — R0981 Nasal congestion: Secondary | ICD-10-CM | POA: Diagnosis not present

## 2020-05-31 DIAGNOSIS — E039 Hypothyroidism, unspecified: Secondary | ICD-10-CM | POA: Diagnosis not present

## 2020-05-31 DIAGNOSIS — Z0000 Encounter for general adult medical examination without abnormal findings: Secondary | ICD-10-CM | POA: Diagnosis not present

## 2020-05-31 DIAGNOSIS — E119 Type 2 diabetes mellitus without complications: Secondary | ICD-10-CM | POA: Diagnosis not present

## 2020-07-19 ENCOUNTER — Ambulatory Visit: Payer: Self-pay | Admitting: Sports Medicine

## 2020-07-21 ENCOUNTER — Ambulatory Visit (INDEPENDENT_AMBULATORY_CARE_PROVIDER_SITE_OTHER): Payer: Medicare Other | Admitting: Sports Medicine

## 2020-07-21 ENCOUNTER — Encounter: Payer: Self-pay | Admitting: Sports Medicine

## 2020-07-21 ENCOUNTER — Ambulatory Visit (INDEPENDENT_AMBULATORY_CARE_PROVIDER_SITE_OTHER): Payer: Medicare Other

## 2020-07-21 ENCOUNTER — Other Ambulatory Visit: Payer: Self-pay

## 2020-07-21 ENCOUNTER — Other Ambulatory Visit: Payer: Self-pay | Admitting: Sports Medicine

## 2020-07-21 DIAGNOSIS — M2142 Flat foot [pes planus] (acquired), left foot: Secondary | ICD-10-CM

## 2020-07-21 DIAGNOSIS — M2141 Flat foot [pes planus] (acquired), right foot: Secondary | ICD-10-CM | POA: Diagnosis not present

## 2020-07-21 DIAGNOSIS — M79671 Pain in right foot: Secondary | ICD-10-CM | POA: Diagnosis not present

## 2020-07-21 DIAGNOSIS — M19071 Primary osteoarthritis, right ankle and foot: Secondary | ICD-10-CM

## 2020-07-21 DIAGNOSIS — M79672 Pain in left foot: Secondary | ICD-10-CM

## 2020-07-21 DIAGNOSIS — M779 Enthesopathy, unspecified: Secondary | ICD-10-CM

## 2020-07-21 MED ORDER — TRIAMCINOLONE ACETONIDE 10 MG/ML IJ SUSP
10.0000 mg | Freq: Once | INTRAMUSCULAR | Status: AC
  Administered 2020-07-21: 10 mg

## 2020-07-21 MED ORDER — DICLOFENAC SODIUM 1 % EX GEL: 4.0000 g | Freq: Four times a day (QID) | CUTANEOUS | 0 refills | Status: AC

## 2020-07-21 NOTE — Progress Notes (Signed)
Subjective: Cristel Rail is a 55 y.o. female patient who presents to office for evaluation of right foot pain. Patient complains of progressive pain especially over the last couple of weeks that keeps getting a little worse especially at night states that the knot on the top of the foot is getting bigger with some soreness has tried icy hot and Epson salt without improvement.  Denies injury or any other acute pedal complaints at this time.  Review of systems noncontributory  Patient is assisted by mother this visit.  Patient Active Problem List   Diagnosis Date Noted  . Spondylolysis, congenital, lumbosacral region 04/27/2015  . Awareness of heartbeats 09/19/2014  . Breathlessness on exertion 09/19/2014  . Essential (primary) hypertension 09/19/2014    Current Outpatient Medications on File Prior to Visit  Medication Sig Dispense Refill  . albuterol (VENTOLIN HFA) 108 (90 Base) MCG/ACT inhaler SMARTSIG:1-2 Puff(s) By Mouth Every 4-6 Hours PRN    . benzonatate (TESSALON) 200 MG capsule Take 200 mg by mouth 3 (three) times daily as needed.    . citalopram (CELEXA) 10 MG tablet Take 10 mg by mouth daily.    Marland Kitchen HYDROcodone-acetaminophen (NORCO/VICODIN) 5-325 MG tablet Take 1-2 tablets by mouth every 4 (four) hours as needed (mild pain). 60 tablet 0  . losartan (COZAAR) 50 MG tablet Take 50 mg by mouth daily.  3  . methocarbamol (ROBAXIN) 500 MG tablet Take 1 tablet (500 mg total) by mouth every 6 (six) hours as needed for muscle spasms. 60 tablet 1  . metoprolol tartrate (LOPRESSOR) 25 MG tablet Take 25 mg by mouth every 12 (twelve) hours.  4  . montelukast (SINGULAIR) 10 MG tablet Take 10 mg by mouth daily.  3  . olmesartan (BENICAR) 20 MG tablet Take 20 mg by mouth daily.    Marland Kitchen oxybutynin (DITROPAN XL) 15 MG 24 hr tablet Take 15 mg by mouth daily.    . potassium chloride (K-DUR) 10 MEQ tablet Take 10 mEq by mouth daily.  3  . pravastatin (PRAVACHOL) 20 MG tablet Take 20 mg by mouth at  bedtime.  2  . ranitidine (ZANTAC) 150 MG tablet Take 150 mg by mouth daily.  4  . spironolactone (ALDACTONE) 25 MG tablet Take 25 mg by mouth daily.  3  . SYNTHROID 150 MCG tablet Take 150 mcg by mouth daily.  0   No current facility-administered medications on file prior to visit.    No Known Allergies  Objective:  General: Alert and oriented x3 in no acute distress  Dermatology: No open lesions bilateral lower extremities, no webspace macerations, no ecchymosis bilateral, all nails x 10 are well manicured.  Vascular: Dorsalis Pedis and Posterior Tibial pedal pulses palpable, Capillary Fill Time 3 seconds,(+) pedal hair growth bilateral, no edema bilateral lower extremities, Temperature gradient within normal limits.  Neurology: Johney Maine sensation intact via light touch bilateral.  Musculoskeletal: Mild tenderness with palpation at dorsal midfoot on the right.  There is a palpable bone spur noted to the dorsal foot right greater than left.  Midtarsal breech supportive of pes planus deformity.  Gait  Xrays  Right foot   Impression: Severe arthritis noted at the midtarsal joint of the right foot there is also diffuse arthritis noted at the ankle and first MPJ and significant midtarsal breech supportive of pes planus deformity.  Assessment and Plan: Problem List Items Addressed This Visit   None   Visit Diagnoses    Right foot pain    -  Primary   Relevant Orders   DG Foot Complete Left   Arthritis of midtarsal joint of right foot       Capsulitis       Pes planus of both feet           -Complete examination performed -Xrays reviewed -Discussed treatment options for right foot arthritis and pain at bone spur -Rx topical Voltaren to use as directed -After oral consent and aseptic prep, injected a mixture containing 1 ml of 2%  plain lidocaine, 1 ml 0.5% plain marcaine, 0.5 ml of kenalog 10 and 0.5 ml of dexamethasone phosphate into right midfoot without complication.  Post-injection care discussed with patient.  -Advised good supportive shoes that do not rub educated patient on proper way to alternately lace her shoes to not rub the top of her foot -Patient to return to office as needed or sooner if condition worsens.  Landis Martins, DPM

## 2020-10-20 ENCOUNTER — Ambulatory Visit (INDEPENDENT_AMBULATORY_CARE_PROVIDER_SITE_OTHER): Payer: Medicare Other | Admitting: Sports Medicine

## 2020-10-20 ENCOUNTER — Other Ambulatory Visit: Payer: Self-pay

## 2020-10-20 ENCOUNTER — Encounter: Payer: Self-pay | Admitting: Sports Medicine

## 2020-10-20 DIAGNOSIS — M79671 Pain in right foot: Secondary | ICD-10-CM

## 2020-10-20 DIAGNOSIS — M19071 Primary osteoarthritis, right ankle and foot: Secondary | ICD-10-CM | POA: Diagnosis not present

## 2020-10-20 DIAGNOSIS — M779 Enthesopathy, unspecified: Secondary | ICD-10-CM | POA: Diagnosis not present

## 2020-10-20 DIAGNOSIS — M792 Neuralgia and neuritis, unspecified: Secondary | ICD-10-CM

## 2020-10-20 DIAGNOSIS — M2141 Flat foot [pes planus] (acquired), right foot: Secondary | ICD-10-CM

## 2020-10-20 DIAGNOSIS — M2142 Flat foot [pes planus] (acquired), left foot: Secondary | ICD-10-CM | POA: Diagnosis not present

## 2020-10-20 NOTE — Progress Notes (Signed)
Subjective: Audrey White is a 55 y.o. female patient who returns to office for follow-up evaluation of right foot pain. Patient reports that she still has pain with the knot on the top of the foot and now some numbness and tingling and sharp pains that goes all of her toes on the right foot.  Patient reports that last injection did not help and so far her soaking in her topical rub has not helped either.  Patient reports that the pain sometimes gets worse when she is walking and standing.  Patient denies any other pedal complaints at this time.  Patient is assisted by mother this visit.  Patient Active Problem List   Diagnosis Date Noted   Spondylolysis, congenital, lumbosacral region 04/27/2015   Awareness of heartbeats 09/19/2014   Breathlessness on exertion 09/19/2014   Essential (primary) hypertension 09/19/2014    Current Outpatient Medications on File Prior to Visit  Medication Sig Dispense Refill   albuterol (VENTOLIN HFA) 108 (90 Base) MCG/ACT inhaler SMARTSIG:1-2 Puff(s) By Mouth Every 4-6 Hours PRN     benzonatate (TESSALON) 200 MG capsule Take 200 mg by mouth 3 (three) times daily as needed.     citalopram (CELEXA) 10 MG tablet Take 10 mg by mouth daily.     diclofenac Sodium (VOLTAREN) 1 % GEL Apply 4 g topically 4 (four) times daily. 150 g 0   HYDROcodone-acetaminophen (NORCO/VICODIN) 5-325 MG tablet Take 1-2 tablets by mouth every 4 (four) hours as needed (mild pain). 60 tablet 0   losartan (COZAAR) 50 MG tablet Take 50 mg by mouth daily.  3   methocarbamol (ROBAXIN) 500 MG tablet Take 1 tablet (500 mg total) by mouth every 6 (six) hours as needed for muscle spasms. 60 tablet 1   metoprolol tartrate (LOPRESSOR) 25 MG tablet Take 25 mg by mouth every 12 (twelve) hours.  4   montelukast (SINGULAIR) 10 MG tablet Take 10 mg by mouth daily.  3   olmesartan (BENICAR) 20 MG tablet Take 20 mg by mouth daily.     oxybutynin (DITROPAN XL) 15 MG 24 hr tablet Take 15 mg by mouth  daily.     potassium chloride (K-DUR) 10 MEQ tablet Take 10 mEq by mouth daily.  3   pravastatin (PRAVACHOL) 20 MG tablet Take 20 mg by mouth at bedtime.  2   ranitidine (ZANTAC) 150 MG tablet Take 150 mg by mouth daily.  4   spironolactone (ALDACTONE) 25 MG tablet Take 25 mg by mouth daily.  3   SYNTHROID 150 MCG tablet Take 150 mcg by mouth daily.  0   No current facility-administered medications on file prior to visit.    No Known Allergies  Objective:  General: Alert and oriented x3 in no acute distress  Dermatology: No open lesions bilateral lower extremities, no webspace macerations, no ecchymosis bilateral, all nails x 10 are well manicured.  Vascular: Dorsalis Pedis and Posterior Tibial pedal pulses palpable, Capillary Fill Time 3 seconds,(+) pedal hair growth bilateral, no edema bilateral lower extremities, Temperature gradient within normal limits.  Neurology: Gross sensation intact via light touch bilateral.  Subjective tingling and sharp shooting pains to all toes of the right foot.  Musculoskeletal: Mild tenderness with palpation at dorsal midfoot on the right.  There is a palpable bone spur noted to the dorsal foot right greater than left.  Midtarsal breech supportive of pes planus deformity.  Mild sharp shooting pain with direct pressure over the superficial nerves at the dorsum of the  right foot.  Assessment and Plan: Problem List Items Addressed This Visit   None Visit Diagnoses     Arthritis of midtarsal joint of right foot    -  Primary   Capsulitis       Right foot pain       Pes planus of both feet       Neuritis             -Complete examination performed -Re-Discussed treatment options for right foot arthritis and pain at bone spur and now neuritis -Advised patient to try over-the-counter Nervive nerve pain relief to see if this will help with the sharp shooting pains -Advised patient that there is no cure for the arthritis but may try Tylenol  arthritis or over-the-counter creams or topical Voltaren which she has as needed -Advised patient that she would get better support from use of a custom molded insoles since she has severe pes planus gave patient's mother the orthotic estimate form for them to call the insurance to check the orthotic benefits meanwhile may continue with over-the-counter orthotics or if not covered may return to office for power steps -Continue with good supportive shoes that do not rub the tops of both feet -Patient to return to office as needed or sooner if condition worsens.  Landis Martins, DPM

## 2020-10-20 NOTE — Patient Instructions (Signed)
Nervive nerve relief supplement for your nerves can be purchased OTC at [facility]  

## 2020-10-23 ENCOUNTER — Telehealth: Payer: Self-pay | Admitting: Sports Medicine

## 2020-10-23 NOTE — Telephone Encounter (Signed)
Received voicemail from Kathy [employer]  medicare and she stated that they would cover the orthotics but it has to be done at one of there facilities. [facility]) or the diabetic store([phone]) and if you just send them the orders. Also just call pt and let pt know when done.

## 2020-11-06 DIAGNOSIS — E119 Type 2 diabetes mellitus without complications: Secondary | ICD-10-CM | POA: Diagnosis not present

## 2020-11-06 DIAGNOSIS — Z13220 Encounter for screening for lipoid disorders: Secondary | ICD-10-CM | POA: Diagnosis not present

## 2020-11-06 DIAGNOSIS — E785 Hyperlipidemia, unspecified: Secondary | ICD-10-CM | POA: Diagnosis not present

## 2020-11-06 DIAGNOSIS — E039 Hypothyroidism, unspecified: Secondary | ICD-10-CM | POA: Diagnosis not present

## 2020-11-06 DIAGNOSIS — Z0000 Encounter for general adult medical examination without abnormal findings: Secondary | ICD-10-CM | POA: Diagnosis not present

## 2020-11-07 DIAGNOSIS — E039 Hypothyroidism, unspecified: Secondary | ICD-10-CM | POA: Diagnosis not present

## 2020-11-07 DIAGNOSIS — E119 Type 2 diabetes mellitus without complications: Secondary | ICD-10-CM | POA: Diagnosis not present

## 2020-11-14 DIAGNOSIS — Z1231 Encounter for screening mammogram for malignant neoplasm of breast: Secondary | ICD-10-CM | POA: Diagnosis not present

## 2020-11-21 DIAGNOSIS — H1045 Other chronic allergic conjunctivitis: Secondary | ICD-10-CM | POA: Diagnosis not present

## 2020-11-21 DIAGNOSIS — H2513 Age-related nuclear cataract, bilateral: Secondary | ICD-10-CM | POA: Diagnosis not present

## 2020-11-21 NOTE — Progress Notes (Signed)
Audrey White  [address] [place],  [place]  [address] [phone]  Clinic Day:  11/28/2020  Referring physician: Ronita Hipps, MD  This document serves as a record of services personally performed by Audrey Macarthur Critchley, MD. It was created on their behalf by [facility] E, a trained medical scribe. The creation of this record is based on the scribe's personal observations and the provider's statements to them.  HISTORY OF PRESENT ILLNESS:  The patient is a 55 y.o. female with relative polycythemia.  JAK2 mutation testing in the past was negative for a myeloproliferative disorder being present.  Furthermore, her hemoglobin has gradually fallen back to a normal level without any particular intervention.  She comes in today for repeat clinical assessment.  Since her last visit, the patient has been doing well.  She continues to deny having any significant headaches, vision changes, or other hyperviscosity-related syndromes which concern her for complications related to her relative polycythemia.  VITALS:  Blood pressure 105/69, pulse 83, temperature 97.7 F (36.5 C), resp. rate 20, height '5\' 5"'$  (1.651 m), weight 264 lb 14.4 oz (120.2 kg), SpO2 95 %.  Wt Readings from Last 3 Encounters:  11/28/20 264 lb 14.4 oz (120.2 kg)  04/27/15 268 lb (121.6 kg)  04/19/15 268 lb (121.6 kg)    Body mass index is 44.08 kg/m.  Performance status (ECOG): 0 - Asymptomatic  PHYSICAL EXAM:  Physical Exam Constitutional:      General: She is not in acute distress.    Appearance: Normal appearance. She is normal weight.  HENT:     Head: Normocephalic and atraumatic.  Eyes:     General: No scleral icterus.    Extraocular Movements: Extraocular movements intact.     Conjunctiva/sclera: Conjunctivae normal.     Pupils: Pupils are equal, round, and reactive to light.  Cardiovascular:     Rate and Rhythm: Normal rate and regular rhythm.     Pulses: Normal pulses.      Heart sounds: Normal heart sounds. No murmur heard.   No friction rub. No gallop.  Pulmonary:     Effort: Pulmonary effort is normal. No respiratory distress.     Breath sounds: Normal breath sounds.  Abdominal:     General: Bowel sounds are normal. There is no distension.     Palpations: Abdomen is soft. There is no hepatomegaly, splenomegaly or mass.     Tenderness: There is no abdominal tenderness.  Musculoskeletal:        General: Normal range of motion.     Cervical back: Normal range of motion and neck supple.     Right lower leg: No edema.     Left lower leg: No edema.  Lymphadenopathy:     Cervical: No cervical adenopathy.  Skin:    General: Skin is warm and dry.  Neurological:     General: No focal deficit present.     Mental Status: She is alert and oriented to person, place, and time. Mental status is at baseline.  Psychiatric:        Mood and Affect: Mood normal.        Behavior: Behavior normal.        Thought Content: Thought content normal.        Judgment: Judgment normal.    LABS:   CBC Latest Ref Rng & Units 11/28/2020 04/19/2015 01/21/2007  WBC - 9.7 12.5(H) 14.8(H)  Hemoglobin 12.0 - 16.0 15.6 16.3(H) 14.0  Hematocrit 36 -  46 47(A) 47.2(H) 41.8  Platelets 150 - 399 209 236 340   ASSESSMENT & PLAN:   Assessment:  A 55 y.o. female with relative polycythemia.  When evaluating her labs today, her hemoglobin today remains normal at 15.6.  As mentioned previously, JAK2 mutation testing done in the past came back negative for her potentially having polycythemia rubra vera.  Furthermore, the patient does not smoke.  This patient's hemoglobin has normalized without any particular intervention.  Overall, I still do not get the sense any type of ominous hematologic process is present.  Per the patient's request, she still wishes to be followed yearly.  I will see her back in 1 year for repeat clinical assessment. The patient understands all the plans discussed today and is  in agreement with them.     I, Rita Ohara, am acting as scribe for [name], MD    I have reviewed this report as typed by the medical scribe, and it is complete and accurate.  Audrey Macarthur Critchley, MD

## 2020-11-22 ENCOUNTER — Telehealth: Payer: Self-pay | Admitting: Oncology

## 2020-11-22 NOTE — Telephone Encounter (Signed)
Patient called to verify 9/13 Appt's

## 2020-11-28 ENCOUNTER — Other Ambulatory Visit: Payer: Self-pay | Admitting: Oncology

## 2020-11-28 ENCOUNTER — Telehealth: Payer: Self-pay | Admitting: Oncology

## 2020-11-28 ENCOUNTER — Inpatient Hospital Stay: Payer: Medicare Other | Attending: Oncology | Admitting: Oncology

## 2020-11-28 ENCOUNTER — Inpatient Hospital Stay: Payer: Medicare Other

## 2020-11-28 ENCOUNTER — Other Ambulatory Visit: Payer: Self-pay | Admitting: Hematology and Oncology

## 2020-11-28 DIAGNOSIS — D751 Secondary polycythemia: Secondary | ICD-10-CM | POA: Diagnosis not present

## 2020-11-28 DIAGNOSIS — D649 Anemia, unspecified: Secondary | ICD-10-CM | POA: Diagnosis not present

## 2020-11-28 DIAGNOSIS — D45 Polycythemia vera: Secondary | ICD-10-CM | POA: Diagnosis not present

## 2020-11-28 LAB — CBC AND DIFFERENTIAL
HCT: 47 — AB (ref 36–46)
Hemoglobin: 15.6 (ref 12.0–16.0)
Neutrophils Absolute: 4.85
Platelets: 209 (ref 150–399)
WBC: 9.7

## 2020-11-28 LAB — CBC
MCV: 87 (ref 81–99)
RBC: 5.45 — AB (ref 3.87–5.11)

## 2020-11-28 NOTE — Telephone Encounter (Signed)
Per 9/13 LOS, patient scheduled for Sept 2023 Appt's.  Gave patient Appt Summary

## 2020-12-24 DIAGNOSIS — M79641 Pain in right hand: Secondary | ICD-10-CM | POA: Diagnosis not present

## 2020-12-24 DIAGNOSIS — R2231 Localized swelling, mass and lump, right upper limb: Secondary | ICD-10-CM | POA: Diagnosis not present

## 2020-12-26 DIAGNOSIS — R2231 Localized swelling, mass and lump, right upper limb: Secondary | ICD-10-CM | POA: Diagnosis not present

## 2020-12-26 DIAGNOSIS — M79641 Pain in right hand: Secondary | ICD-10-CM | POA: Diagnosis not present

## 2021-04-24 DIAGNOSIS — J329 Chronic sinusitis, unspecified: Secondary | ICD-10-CM | POA: Diagnosis not present

## 2021-04-24 DIAGNOSIS — J40 Bronchitis, not specified as acute or chronic: Secondary | ICD-10-CM | POA: Diagnosis not present

## 2021-07-08 DIAGNOSIS — H6503 Acute serous otitis media, bilateral: Secondary | ICD-10-CM | POA: Diagnosis not present

## 2021-07-08 DIAGNOSIS — N390 Urinary tract infection, site not specified: Secondary | ICD-10-CM | POA: Diagnosis not present

## 2021-07-09 DIAGNOSIS — R300 Dysuria: Secondary | ICD-10-CM | POA: Diagnosis not present

## 2021-08-02 DIAGNOSIS — Z760 Encounter for issue of repeat prescription: Secondary | ICD-10-CM | POA: Diagnosis not present

## 2021-08-02 DIAGNOSIS — E785 Hyperlipidemia, unspecified: Secondary | ICD-10-CM | POA: Diagnosis not present

## 2021-08-02 DIAGNOSIS — E119 Type 2 diabetes mellitus without complications: Secondary | ICD-10-CM | POA: Diagnosis not present

## 2021-08-02 DIAGNOSIS — Z0000 Encounter for general adult medical examination without abnormal findings: Secondary | ICD-10-CM | POA: Diagnosis not present

## 2021-08-02 DIAGNOSIS — I10 Essential (primary) hypertension: Secondary | ICD-10-CM | POA: Diagnosis not present

## 2021-08-02 DIAGNOSIS — E039 Hypothyroidism, unspecified: Secondary | ICD-10-CM | POA: Diagnosis not present

## 2021-08-02 DIAGNOSIS — D582 Other hemoglobinopathies: Secondary | ICD-10-CM | POA: Diagnosis not present

## 2021-09-25 DIAGNOSIS — M19049 Primary osteoarthritis, unspecified hand: Secondary | ICD-10-CM | POA: Diagnosis not present

## 2021-09-25 DIAGNOSIS — E039 Hypothyroidism, unspecified: Secondary | ICD-10-CM | POA: Diagnosis not present

## 2021-09-25 DIAGNOSIS — M79602 Pain in left arm: Secondary | ICD-10-CM | POA: Diagnosis not present

## 2021-10-03 DIAGNOSIS — M79641 Pain in right hand: Secondary | ICD-10-CM | POA: Diagnosis not present

## 2021-10-03 DIAGNOSIS — M65341 Trigger finger, right ring finger: Secondary | ICD-10-CM | POA: Diagnosis not present

## 2021-11-06 DIAGNOSIS — M65341 Trigger finger, right ring finger: Secondary | ICD-10-CM | POA: Diagnosis not present

## 2021-11-06 DIAGNOSIS — R202 Paresthesia of skin: Secondary | ICD-10-CM | POA: Diagnosis not present

## 2021-11-06 DIAGNOSIS — M79641 Pain in right hand: Secondary | ICD-10-CM | POA: Diagnosis not present

## 2021-11-06 DIAGNOSIS — R200 Anesthesia of skin: Secondary | ICD-10-CM | POA: Diagnosis not present

## 2021-11-06 DIAGNOSIS — Z4889 Encounter for other specified surgical aftercare: Secondary | ICD-10-CM | POA: Diagnosis not present

## 2021-11-13 DIAGNOSIS — M79641 Pain in right hand: Secondary | ICD-10-CM | POA: Diagnosis not present

## 2021-11-26 DIAGNOSIS — E785 Hyperlipidemia, unspecified: Secondary | ICD-10-CM | POA: Diagnosis not present

## 2021-11-26 DIAGNOSIS — E039 Hypothyroidism, unspecified: Secondary | ICD-10-CM | POA: Diagnosis not present

## 2021-11-27 NOTE — Progress Notes (Signed)
[place]  [address] [place],  [place]  [address] [phone]  Clinic Day:  11/28/2021  Referring physician: Ronita Hipps, MD  HISTORY OF PRESENT ILLNESS:  The patient is a 56 y.o. female with relative polycythemia.  JAK2 mutation testing in the past was negative for a myeloproliferative disorder being present.  Furthermore, her hemoglobin has gradually fallen back to a normal level without any particular intervention.  She comes in today for repeat clinical assessment.  Since her last visit, the patient has been doing well.  She continues to deny having any significant headaches, vision changes, or other hyperviscosity-related syndromes which concern her for complications related to her relative polycythemia.  VITALS:  Blood pressure 137/78, pulse (!) 55, temperature 98.6 F (37 C), resp. rate 16, height '5\' 5"'$  (1.651 m), weight 277 lb 6.4 oz (125.8 kg), SpO2 96 %.  Wt Readings from Last 3 Encounters:  11/28/21 277 lb 6.4 oz (125.8 kg)  11/28/20 264 lb 14.4 oz (120.2 kg)  04/27/15 268 lb (121.6 kg)    Body mass index is 46.16 kg/m.  Performance status (ECOG): 0 - Asymptomatic  PHYSICAL EXAM:  Physical Exam Constitutional:      General: She is not in acute distress.    Appearance: Normal appearance. She is normal weight.  HENT:     Head: Normocephalic and atraumatic.  Eyes:     General: No scleral icterus.    Extraocular Movements: Extraocular movements intact.     Conjunctiva/sclera: Conjunctivae normal.     Pupils: Pupils are equal, round, and reactive to light.  Cardiovascular:     Rate and Rhythm: Normal rate and regular rhythm.     Pulses: Normal pulses.     Heart sounds: Normal heart sounds. No murmur heard.    No friction rub. No gallop.  Pulmonary:     Effort: Pulmonary effort is normal. No respiratory distress.     Breath sounds: Normal breath sounds.  Abdominal:     General: Bowel sounds are normal. There is no  distension.     Palpations: Abdomen is soft. There is no hepatomegaly, splenomegaly or mass.     Tenderness: There is no abdominal tenderness.  Musculoskeletal:        General: Normal range of motion.     Cervical back: Normal range of motion and neck supple.     Right lower leg: No edema.     Left lower leg: No edema.  Lymphadenopathy:     Cervical: No cervical adenopathy.  Skin:    General: Skin is warm and dry.  Neurological:     General: No focal deficit present.     Mental Status: She is alert and oriented to person, place, and time. Mental status is at baseline.  Psychiatric:        Mood and Affect: Mood normal.        Behavior: Behavior normal.        Thought Content: Thought content normal.        Judgment: Judgment normal.    LABS:  =    Latest Ref Rng & Units 11/28/2021   12:00 AM 11/28/2020   12:00 AM 04/19/2015    2:45 PM  CBC  WBC  8.6     9.7     12.5   Hemoglobin 12.0 - 16.0 15.6     15.6     16.3   Hematocrit 36 - 46 46     47  47.2   Platelets 150 - 400 K/uL 229     209     236      This result is from an external source.   ASSESSMENT & PLAN:  A 56 y.o. female with a history of relative polycythemia.  When evaluating her labs today, her hemoglobin today remains normal at 15.6, which is the exact same level it was last year.  Overall, I am underwhelmed with any type of hematologic process being present.  Clinically, the patient is doing well.  As that is the case, I will turn her care back over to her primary care office with recommendation that her CBC be checked, at most, 1-2 times per year.  I would not have a problem seeing her in the future if new hematologic issues arise that require repeat clinical assessment. The patient understands all the plans discussed today and is in agreement with them.   Edelmiro Innocent Macarthur Critchley, MD

## 2021-11-28 ENCOUNTER — Inpatient Hospital Stay (INDEPENDENT_AMBULATORY_CARE_PROVIDER_SITE_OTHER): Payer: Medicare Other | Admitting: Oncology

## 2021-11-28 ENCOUNTER — Inpatient Hospital Stay: Payer: Medicare Other | Attending: Oncology

## 2021-11-28 VITALS — BP 137/78 | HR 55 | Temp 98.6°F | Resp 16 | Ht 65.0 in | Wt 277.4 lb

## 2021-11-28 DIAGNOSIS — D751 Secondary polycythemia: Secondary | ICD-10-CM

## 2021-11-28 DIAGNOSIS — D649 Anemia, unspecified: Secondary | ICD-10-CM | POA: Diagnosis not present

## 2021-11-28 LAB — CBC AND DIFFERENTIAL
HCT: 46 (ref 36–46)
Hemoglobin: 15.6 (ref 12.0–16.0)
Neutrophils Absolute: 4.39
Platelets: 229 10*3/uL (ref 150–400)
WBC: 8.6

## 2021-11-28 LAB — CBC: RBC: 5.36 — AB (ref 3.87–5.11)

## 2021-11-29 DIAGNOSIS — H2513 Age-related nuclear cataract, bilateral: Secondary | ICD-10-CM | POA: Diagnosis not present

## 2021-12-04 DIAGNOSIS — M19031 Primary osteoarthritis, right wrist: Secondary | ICD-10-CM | POA: Diagnosis not present

## 2021-12-31 DIAGNOSIS — J0100 Acute maxillary sinusitis, unspecified: Secondary | ICD-10-CM | POA: Diagnosis not present

## 2021-12-31 DIAGNOSIS — H6691 Otitis media, unspecified, right ear: Secondary | ICD-10-CM | POA: Diagnosis not present

## 2021-12-31 DIAGNOSIS — H6692 Otitis media, unspecified, left ear: Secondary | ICD-10-CM | POA: Diagnosis not present

## 2022-01-08 DIAGNOSIS — J069 Acute upper respiratory infection, unspecified: Secondary | ICD-10-CM | POA: Diagnosis not present

## 2022-01-08 DIAGNOSIS — R0981 Nasal congestion: Secondary | ICD-10-CM | POA: Diagnosis not present

## 2022-01-31 DIAGNOSIS — Z1231 Encounter for screening mammogram for malignant neoplasm of breast: Secondary | ICD-10-CM | POA: Diagnosis not present

## 2022-02-05 DIAGNOSIS — M65341 Trigger finger, right ring finger: Secondary | ICD-10-CM | POA: Diagnosis not present

## 2022-04-16 DIAGNOSIS — M25551 Pain in right hip: Secondary | ICD-10-CM | POA: Diagnosis not present

## 2022-04-16 DIAGNOSIS — M7061 Trochanteric bursitis, right hip: Secondary | ICD-10-CM | POA: Diagnosis not present

## 2022-05-14 DIAGNOSIS — M419 Scoliosis, unspecified: Secondary | ICD-10-CM | POA: Diagnosis not present

## 2022-05-14 DIAGNOSIS — M5136 Other intervertebral disc degeneration, lumbar region: Secondary | ICD-10-CM | POA: Diagnosis not present

## 2022-05-14 DIAGNOSIS — G8929 Other chronic pain: Secondary | ICD-10-CM | POA: Diagnosis not present

## 2022-05-14 DIAGNOSIS — M545 Low back pain, unspecified: Secondary | ICD-10-CM | POA: Diagnosis not present

## 2022-05-29 DIAGNOSIS — M6281 Muscle weakness (generalized): Secondary | ICD-10-CM | POA: Diagnosis not present

## 2022-05-29 DIAGNOSIS — M545 Low back pain, unspecified: Secondary | ICD-10-CM | POA: Diagnosis not present

## 2022-06-03 DIAGNOSIS — E039 Hypothyroidism, unspecified: Secondary | ICD-10-CM | POA: Diagnosis not present

## 2022-06-03 DIAGNOSIS — E119 Type 2 diabetes mellitus without complications: Secondary | ICD-10-CM | POA: Diagnosis not present

## 2022-06-05 DIAGNOSIS — M545 Low back pain, unspecified: Secondary | ICD-10-CM | POA: Diagnosis not present

## 2022-06-05 DIAGNOSIS — M6281 Muscle weakness (generalized): Secondary | ICD-10-CM | POA: Diagnosis not present

## 2022-06-07 DIAGNOSIS — M6281 Muscle weakness (generalized): Secondary | ICD-10-CM | POA: Diagnosis not present

## 2022-06-07 DIAGNOSIS — M545 Low back pain, unspecified: Secondary | ICD-10-CM | POA: Diagnosis not present

## 2022-06-12 DIAGNOSIS — M6281 Muscle weakness (generalized): Secondary | ICD-10-CM | POA: Diagnosis not present

## 2022-06-12 DIAGNOSIS — M545 Low back pain, unspecified: Secondary | ICD-10-CM | POA: Diagnosis not present

## 2022-06-19 DIAGNOSIS — M545 Low back pain, unspecified: Secondary | ICD-10-CM | POA: Diagnosis not present

## 2022-06-19 DIAGNOSIS — M6281 Muscle weakness (generalized): Secondary | ICD-10-CM | POA: Diagnosis not present

## 2022-06-21 DIAGNOSIS — M6281 Muscle weakness (generalized): Secondary | ICD-10-CM | POA: Diagnosis not present

## 2022-06-21 DIAGNOSIS — M545 Low back pain, unspecified: Secondary | ICD-10-CM | POA: Diagnosis not present

## 2022-06-25 DIAGNOSIS — G8929 Other chronic pain: Secondary | ICD-10-CM | POA: Diagnosis not present

## 2022-06-25 DIAGNOSIS — M5442 Lumbago with sciatica, left side: Secondary | ICD-10-CM | POA: Diagnosis not present

## 2022-06-25 DIAGNOSIS — M545 Low back pain, unspecified: Secondary | ICD-10-CM | POA: Diagnosis not present

## 2022-06-25 DIAGNOSIS — M5441 Lumbago with sciatica, right side: Secondary | ICD-10-CM | POA: Diagnosis not present

## 2022-06-25 DIAGNOSIS — M6281 Muscle weakness (generalized): Secondary | ICD-10-CM | POA: Diagnosis not present

## 2022-06-27 DIAGNOSIS — M6281 Muscle weakness (generalized): Secondary | ICD-10-CM | POA: Diagnosis not present

## 2022-06-27 DIAGNOSIS — M545 Low back pain, unspecified: Secondary | ICD-10-CM | POA: Diagnosis not present

## 2022-07-02 DIAGNOSIS — M6281 Muscle weakness (generalized): Secondary | ICD-10-CM | POA: Diagnosis not present

## 2022-07-02 DIAGNOSIS — M545 Low back pain, unspecified: Secondary | ICD-10-CM | POA: Diagnosis not present

## 2022-07-04 DIAGNOSIS — M6281 Muscle weakness (generalized): Secondary | ICD-10-CM | POA: Diagnosis not present

## 2022-07-04 DIAGNOSIS — M545 Low back pain, unspecified: Secondary | ICD-10-CM | POA: Diagnosis not present

## 2022-07-09 DIAGNOSIS — M6281 Muscle weakness (generalized): Secondary | ICD-10-CM | POA: Diagnosis not present

## 2022-07-09 DIAGNOSIS — M545 Low back pain, unspecified: Secondary | ICD-10-CM | POA: Diagnosis not present

## 2022-07-11 DIAGNOSIS — M6281 Muscle weakness (generalized): Secondary | ICD-10-CM | POA: Diagnosis not present

## 2022-07-11 DIAGNOSIS — M545 Low back pain, unspecified: Secondary | ICD-10-CM | POA: Diagnosis not present

## 2022-07-15 DIAGNOSIS — M6281 Muscle weakness (generalized): Secondary | ICD-10-CM | POA: Diagnosis not present

## 2022-07-15 DIAGNOSIS — M545 Low back pain, unspecified: Secondary | ICD-10-CM | POA: Diagnosis not present

## 2022-08-06 DIAGNOSIS — M19032 Primary osteoarthritis, left wrist: Secondary | ICD-10-CM | POA: Diagnosis not present

## 2022-08-08 DIAGNOSIS — I10 Essential (primary) hypertension: Secondary | ICD-10-CM | POA: Diagnosis not present

## 2022-08-08 DIAGNOSIS — E039 Hypothyroidism, unspecified: Secondary | ICD-10-CM | POA: Diagnosis not present

## 2022-08-08 DIAGNOSIS — Z0000 Encounter for general adult medical examination without abnormal findings: Secondary | ICD-10-CM | POA: Diagnosis not present

## 2022-08-08 DIAGNOSIS — R300 Dysuria: Secondary | ICD-10-CM | POA: Diagnosis not present

## 2022-08-08 DIAGNOSIS — K219 Gastro-esophageal reflux disease without esophagitis: Secondary | ICD-10-CM | POA: Diagnosis not present

## 2022-08-08 DIAGNOSIS — M25561 Pain in right knee: Secondary | ICD-10-CM | POA: Diagnosis not present

## 2022-09-03 DIAGNOSIS — R202 Paresthesia of skin: Secondary | ICD-10-CM | POA: Diagnosis not present

## 2022-09-03 DIAGNOSIS — R200 Anesthesia of skin: Secondary | ICD-10-CM | POA: Diagnosis not present

## 2022-09-25 DIAGNOSIS — J069 Acute upper respiratory infection, unspecified: Secondary | ICD-10-CM | POA: Diagnosis not present

## 2022-09-25 DIAGNOSIS — G5603 Carpal tunnel syndrome, bilateral upper limbs: Secondary | ICD-10-CM | POA: Diagnosis not present

## 2022-09-25 DIAGNOSIS — R0981 Nasal congestion: Secondary | ICD-10-CM | POA: Diagnosis not present

## 2022-09-25 DIAGNOSIS — R051 Acute cough: Secondary | ICD-10-CM | POA: Diagnosis not present

## 2022-10-01 DIAGNOSIS — R051 Acute cough: Secondary | ICD-10-CM | POA: Diagnosis not present

## 2022-10-01 DIAGNOSIS — R0981 Nasal congestion: Secondary | ICD-10-CM | POA: Diagnosis not present

## 2022-10-07 DIAGNOSIS — J4521 Mild intermittent asthma with (acute) exacerbation: Secondary | ICD-10-CM | POA: Diagnosis not present

## 2022-10-07 DIAGNOSIS — Z7952 Long term (current) use of systemic steroids: Secondary | ICD-10-CM | POA: Diagnosis not present

## 2022-10-07 DIAGNOSIS — Z79899 Other long term (current) drug therapy: Secondary | ICD-10-CM | POA: Diagnosis not present

## 2022-10-07 DIAGNOSIS — R0602 Shortness of breath: Secondary | ICD-10-CM | POA: Diagnosis not present

## 2022-11-05 DIAGNOSIS — R202 Paresthesia of skin: Secondary | ICD-10-CM | POA: Diagnosis not present

## 2022-11-05 DIAGNOSIS — R200 Anesthesia of skin: Secondary | ICD-10-CM | POA: Diagnosis not present

## 2022-12-02 DIAGNOSIS — H2513 Age-related nuclear cataract, bilateral: Secondary | ICD-10-CM | POA: Diagnosis not present

## 2022-12-09 DIAGNOSIS — E119 Type 2 diabetes mellitus without complications: Secondary | ICD-10-CM | POA: Diagnosis not present

## 2022-12-09 DIAGNOSIS — E785 Hyperlipidemia, unspecified: Secondary | ICD-10-CM | POA: Diagnosis not present

## 2022-12-09 DIAGNOSIS — J309 Allergic rhinitis, unspecified: Secondary | ICD-10-CM | POA: Diagnosis not present

## 2022-12-09 DIAGNOSIS — E039 Hypothyroidism, unspecified: Secondary | ICD-10-CM | POA: Diagnosis not present

## 2022-12-10 DIAGNOSIS — G5603 Carpal tunnel syndrome, bilateral upper limbs: Secondary | ICD-10-CM | POA: Diagnosis not present

## 2022-12-12 ENCOUNTER — Other Ambulatory Visit: Payer: Self-pay | Admitting: Orthopedic Surgery

## 2023-01-27 ENCOUNTER — Encounter (HOSPITAL_BASED_OUTPATIENT_CLINIC_OR_DEPARTMENT_OTHER): Payer: Self-pay | Admitting: Orthopedic Surgery

## 2023-01-27 ENCOUNTER — Other Ambulatory Visit: Payer: Self-pay

## 2023-01-29 ENCOUNTER — Encounter (HOSPITAL_BASED_OUTPATIENT_CLINIC_OR_DEPARTMENT_OTHER)
Admission: RE | Admit: 2023-01-29 | Discharge: 2023-01-29 | Disposition: A | Discharge status: Home / Self Care | Payer: 59 | Source: Ambulatory Visit | Attending: Orthopedic Surgery | Admitting: Orthopedic Surgery

## 2023-01-29 ENCOUNTER — Other Ambulatory Visit: Payer: Self-pay

## 2023-01-29 DIAGNOSIS — Z01812 Encounter for preprocedural laboratory examination: Secondary | ICD-10-CM | POA: Diagnosis not present

## 2023-01-29 LAB — BASIC METABOLIC PANEL
Anion gap: 9 (ref 5–15)
BUN: 10 mg/dL (ref 6–20)
CO2: 22 mmol/L (ref 22–32)
Calcium: 10.2 mg/dL (ref 8.9–10.3)
Chloride: 107 mmol/L (ref 98–111)
Creatinine, Ser: 1.14 mg/dL — ABNORMAL HIGH (ref 0.44–1.00)
GFR, Estimated: 56 mL/min — ABNORMAL LOW (ref >60)
Glucose, Bld: 79 mg/dL (ref 70–99)
Potassium: 4.5 mmol/L (ref 3.5–5.1)
Sodium: 138 mmol/L (ref 135–145)

## 2023-01-29 NOTE — Progress Notes (Signed)

## 2023-02-04 ENCOUNTER — Other Ambulatory Visit: Payer: Self-pay

## 2023-02-04 ENCOUNTER — Encounter (HOSPITAL_BASED_OUTPATIENT_CLINIC_OR_DEPARTMENT_OTHER)
Admission: RE | Disposition: A | Discharge status: Home / Self Care | Payer: Self-pay | Source: Home / Self Care | Attending: Orthopedic Surgery

## 2023-02-04 ENCOUNTER — Encounter (HOSPITAL_BASED_OUTPATIENT_CLINIC_OR_DEPARTMENT_OTHER): Payer: Self-pay | Admitting: Orthopedic Surgery

## 2023-02-04 ENCOUNTER — Ambulatory Visit (HOSPITAL_BASED_OUTPATIENT_CLINIC_OR_DEPARTMENT_OTHER): Payer: 59 | Admitting: Anesthesiology

## 2023-02-04 ENCOUNTER — Ambulatory Visit (HOSPITAL_BASED_OUTPATIENT_CLINIC_OR_DEPARTMENT_OTHER)
Admission: RE | Admit: 2023-02-04 | Discharge: 2023-02-04 | Disposition: A | Discharge status: Home / Self Care | Payer: 59 | Attending: Orthopedic Surgery | Admitting: Orthopedic Surgery

## 2023-02-04 DIAGNOSIS — G4730 Sleep apnea, unspecified: Secondary | ICD-10-CM | POA: Diagnosis not present

## 2023-02-04 DIAGNOSIS — J449 Chronic obstructive pulmonary disease, unspecified: Secondary | ICD-10-CM | POA: Insufficient documentation

## 2023-02-04 DIAGNOSIS — E039 Hypothyroidism, unspecified: Secondary | ICD-10-CM | POA: Diagnosis not present

## 2023-02-04 DIAGNOSIS — G5602 Carpal tunnel syndrome, left upper limb: Secondary | ICD-10-CM | POA: Insufficient documentation

## 2023-02-04 DIAGNOSIS — I10 Essential (primary) hypertension: Secondary | ICD-10-CM | POA: Diagnosis not present

## 2023-02-04 DIAGNOSIS — Z01818 Encounter for other preprocedural examination: Secondary | ICD-10-CM

## 2023-02-04 HISTORY — PX: CARPAL TUNNEL RELEASE: SHX101

## 2023-02-04 SURGERY — CARPAL TUNNEL RELEASE
Anesthesia: General | Site: Hand | Laterality: Left

## 2023-02-04 MED ORDER — CEFAZOLIN IN SODIUM CHLORIDE 3-0.9 GM/100ML-% IV SOLN
INTRAVENOUS | Status: AC
  Filled 2023-02-04: qty 100

## 2023-02-04 MED ORDER — BUPIVACAINE HCL (PF) 0.25 % IJ SOLN
INTRAMUSCULAR | Status: AC
  Filled 2023-02-04: qty 30

## 2023-02-04 MED ORDER — MIDAZOLAM HCL 2 MG/2ML IJ SOLN: 0.5000 mg | Freq: Once | INTRAMUSCULAR | Status: DC | PRN

## 2023-02-04 MED ORDER — FENTANYL CITRATE (PF) 100 MCG/2ML IJ SOLN: 25.0000 ug | INTRAMUSCULAR | Status: DC | PRN

## 2023-02-04 MED ORDER — OXYCODONE HCL 5 MG/5ML PO SOLN: 5.0000 mg | Freq: Once | ORAL | Status: DC | PRN

## 2023-02-04 MED ORDER — ONDANSETRON HCL 4 MG/2ML IJ SOLN
INTRAMUSCULAR | Status: AC
  Filled 2023-02-04: qty 2

## 2023-02-04 MED ORDER — EPHEDRINE SULFATE (PRESSORS) 50 MG/ML IJ SOLN
INTRAMUSCULAR | Status: DC | PRN
  Administered 2023-02-04: 5 mg via INTRAVENOUS

## 2023-02-04 MED ORDER — PROPOFOL 10 MG/ML IV BOLUS
INTRAVENOUS | Status: AC
  Filled 2023-02-04: qty 20

## 2023-02-04 MED ORDER — SODIUM CHLORIDE 0.9 % IV SOLN: INTRAVENOUS | Status: DC | PRN

## 2023-02-04 MED ORDER — OXYCODONE HCL 5 MG PO TABS: 5.0000 mg | ORAL_TABLET | Freq: Once | ORAL | Status: DC | PRN

## 2023-02-04 MED ORDER — GLYCOPYRROLATE 0.2 MG/ML IJ SOLN
INTRAMUSCULAR | Status: DC | PRN
  Administered 2023-02-04: .1 mg via INTRAVENOUS

## 2023-02-04 MED ORDER — CEFAZOLIN IN SODIUM CHLORIDE 3-0.9 GM/100ML-% IV SOLN
3.0000 g | INTRAVENOUS | Status: AC
  Administered 2023-02-04: 3 g via INTRAVENOUS

## 2023-02-04 MED ORDER — LIDOCAINE HCL (CARDIAC) PF 100 MG/5ML IV SOSY
PREFILLED_SYRINGE | INTRAVENOUS | Status: DC | PRN
  Administered 2023-02-04: 60 mg via INTRAVENOUS

## 2023-02-04 MED ORDER — FENTANYL CITRATE (PF) 100 MCG/2ML IJ SOLN
INTRAMUSCULAR | Status: AC
  Filled 2023-02-04: qty 2

## 2023-02-04 MED ORDER — MEPERIDINE HCL 25 MG/ML IJ SOLN: 6.2500 mg | INTRAMUSCULAR | Status: DC | PRN

## 2023-02-04 MED ORDER — LACTATED RINGERS IV SOLN: INTRAVENOUS | Status: DC

## 2023-02-04 MED ORDER — 0.9 % SODIUM CHLORIDE (POUR BTL) OPTIME
TOPICAL | Status: DC | PRN
Start: 2023-02-04 — End: 2023-02-04
  Administered 2023-02-04: 200 mL

## 2023-02-04 MED ORDER — BUPIVACAINE HCL (PF) 0.25 % IJ SOLN
INTRAMUSCULAR | Status: DC | PRN
  Administered 2023-02-04: 9 mL

## 2023-02-04 MED ORDER — ACETAMINOPHEN 500 MG PO TABS
1000.0000 mg | ORAL_TABLET | Freq: Once | ORAL | Status: AC
  Administered 2023-02-04: 1000 mg via ORAL

## 2023-02-04 MED ORDER — EPHEDRINE 5 MG/ML INJ
INTRAVENOUS | Status: AC
  Filled 2023-02-04: qty 5

## 2023-02-04 MED ORDER — ACETAMINOPHEN 500 MG PO TABS
ORAL_TABLET | ORAL | Status: AC
  Filled 2023-02-04: qty 2

## 2023-02-04 MED ORDER — LIDOCAINE 2% (20 MG/ML) 5 ML SYRINGE
INTRAMUSCULAR | Status: AC
Start: 2023-02-04 — End: ?
  Filled 2023-02-04: qty 5

## 2023-02-04 MED ORDER — ONDANSETRON HCL 4 MG/2ML IJ SOLN
INTRAMUSCULAR | Status: DC | PRN
  Administered 2023-02-04: 4 mg via INTRAVENOUS

## 2023-02-04 MED ORDER — MIDAZOLAM HCL 2 MG/2ML IJ SOLN
INTRAMUSCULAR | Status: AC
  Filled 2023-02-04: qty 2

## 2023-02-04 MED ORDER — PROPOFOL 500 MG/50ML IV EMUL
INTRAVENOUS | Status: AC
  Filled 2023-02-04: qty 50

## 2023-02-04 MED ORDER — FENTANYL CITRATE (PF) 100 MCG/2ML IJ SOLN
INTRAMUSCULAR | Status: DC | PRN
  Administered 2023-02-04 (×2): 50 ug via INTRAVENOUS

## 2023-02-04 MED ORDER — PROPOFOL 10 MG/ML IV BOLUS
INTRAVENOUS | Status: DC | PRN
  Administered 2023-02-04: 40 mg via INTRAVENOUS
  Administered 2023-02-04: 200 mg via INTRAVENOUS

## 2023-02-04 MED ORDER — GLYCOPYRROLATE PF 0.2 MG/ML IJ SOSY
PREFILLED_SYRINGE | INTRAMUSCULAR | Status: AC
  Filled 2023-02-04: qty 1

## 2023-02-04 MED ORDER — TRAMADOL HCL 50 MG PO TABS: ORAL_TABLET | ORAL | 0 refills | Status: AC

## 2023-02-04 SURGICAL SUPPLY — 31 items
BLADE SURG 15 STRL LF DISP TIS (BLADE) ×2 IMPLANT
BLADE SURG 15 STRL SS (BLADE) ×2
BNDG ELASTIC 3INX 5YD STR LF (GAUZE/BANDAGES/DRESSINGS) ×1 IMPLANT
BNDG ESMARK 4X9 LF (GAUZE/BANDAGES/DRESSINGS) IMPLANT
BNDG GAUZE DERMACEA FLUFF 4 (GAUZE/BANDAGES/DRESSINGS) ×1 IMPLANT
CHLORAPREP W/TINT 26 (MISCELLANEOUS) ×1 IMPLANT
CORD BIPOLAR FORCEPS 12FT (ELECTRODE) ×1 IMPLANT
COVER BACK TABLE 60X90IN (DRAPES) ×1 IMPLANT
COVER MAYO STAND STRL (DRAPES) ×1 IMPLANT
CUFF TOURN SGL QUICK 18X4 (TOURNIQUET CUFF) ×1 IMPLANT
DRAPE EXTREMITY T 121X128X90 (DISPOSABLE) ×1 IMPLANT
DRAPE SURG 17X23 STRL (DRAPES) ×1 IMPLANT
GAUZE PAD ABD 8X10 STRL (GAUZE/BANDAGES/DRESSINGS) ×1 IMPLANT
GAUZE SPONGE 4X4 12PLY STRL (GAUZE/BANDAGES/DRESSINGS) ×1 IMPLANT
GAUZE XEROFORM 1X8 LF (GAUZE/BANDAGES/DRESSINGS) ×1 IMPLANT
GLOVE BIO SURGEON STRL SZ7.5 (GLOVE) ×1 IMPLANT
GLOVE BIOGEL PI IND STRL 8 (GLOVE) ×1 IMPLANT
GOWN STRL REUS W/ TWL LRG LVL3 (GOWN DISPOSABLE) ×1 IMPLANT
GOWN STRL REUS W/TWL LRG LVL3 (GOWN DISPOSABLE) ×1
GOWN STRL REUS W/TWL XL LVL3 (GOWN DISPOSABLE) ×1 IMPLANT
NDL HYPO 25X1 1.5 SAFETY (NEEDLE) ×1 IMPLANT
NEEDLE HYPO 25X1 1.5 SAFETY (NEEDLE) ×1
NS IRRIG 1000ML POUR BTL (IV SOLUTION) ×1 IMPLANT
PACK BASIN DAY SURGERY FS (CUSTOM PROCEDURE TRAY) ×1 IMPLANT
PADDING CAST ABS COTTON 4X4 ST (CAST SUPPLIES) ×1 IMPLANT
STOCKINETTE 4X48 STRL (DRAPES) ×1 IMPLANT
SUT ETHILON 4 0 PS 2 18 (SUTURE) ×1 IMPLANT
SYR BULB EAR ULCER 3OZ GRN STR (SYRINGE) ×1 IMPLANT
SYR CONTROL 10ML LL (SYRINGE) ×1 IMPLANT
TOWEL GREEN STERILE FF (TOWEL DISPOSABLE) ×2 IMPLANT
UNDERPAD 30X36 HEAVY ABSORB (UNDERPADS AND DIAPERS) ×1 IMPLANT

## 2023-02-04 NOTE — Anesthesia Preprocedure Evaluation (Addendum)
Anesthesia Evaluation  Patient identified by MRN, date of birth, ID band Patient awake    Reviewed: Allergy & Precautions, NPO status , Patient's Chart, lab work & pertinent test results, reviewed documented beta blocker date and time   History of Anesthesia Complications Negative for: history of anesthetic complications  Airway Mallampati: II  TM Distance: >3 FB Neck ROM: Full    Dental  (+) Dental Advisory Given, Teeth Intact   Pulmonary sleep apnea (does not use CPAP) , COPD,  COPD inhaler   breath sounds clear to auscultation       Cardiovascular hypertension, Pt. on medications and Pt. on home beta blockers (-) angina  Rhythm:Regular Rate:Normal     Neuro/Psych  Headaches    GI/Hepatic Neg liver ROS,GERD  Medicated and Controlled,,  Endo/Other  Hypothyroidism  BMI 45.4  Renal/GU negative Renal ROS     Musculoskeletal   Abdominal   Peds  Hematology negative hematology ROS (+)   Anesthesia Other Findings   Reproductive/Obstetrics                             Anesthesia Physical Anesthesia Plan  ASA: 3  Anesthesia Plan:    Post-op Pain Management: Tylenol PO (pre-op)*   Induction: Intravenous  PONV Risk Score and Plan: 2 and Ondansetron and Dexamethasone  Airway Management Planned: LMA  Additional Equipment: None  Intra-op Plan:   Post-operative Plan:   Informed Consent: I have reviewed the patients History and Physical, chart, labs and discussed the procedure including the risks, benefits and alternatives for the proposed anesthesia with the patient or authorized representative who has indicated his/her understanding and acceptance.     Dental advisory given  Plan Discussed with: CRNA and Surgeon  Anesthesia Plan Comments:         Anesthesia Quick Evaluation
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
## 2023-02-04 NOTE — Transfer of Care (Signed)
Immediate Anesthesia Transfer of Care Note  Patient: Audrey White  Procedure(s) Performed: REPEAT CARPAL TUNNEL RELEASE LEFT (Left: Hand)  Patient Location: PACU  Anesthesia Type:General  Level of Consciousness: awake, alert , oriented, and patient cooperative  Airway & Oxygen Therapy: Patient Spontanous Breathing and Patient connected to face mask oxygen  Post-op Assessment: Report given to RN and Post -op Vital signs reviewed and stable  Post vital signs: Reviewed and stable  Last Vitals:  Vitals Value Taken Time  BP 101/56 02/04/23 1448  Temp    Pulse 62 02/04/23 1451  Resp 16 02/04/23 1451  SpO2 95 % 02/04/23 1451  Vitals shown include unfiled device data.  Last Pain:  Vitals:   02/04/23 1132  TempSrc: Temporal  PainSc: 8       Patients Stated Pain Goal: 4 (02/04/23 1132)  Complications: No notable events documented.

## 2023-02-04 NOTE — Op Note (Signed)
NAME: Audrey White MEDICAL RECORD NO: [id number] DATE OF BIRTH: [birth year] FACILITY: [facility] LOCATION: [facility] PHYSICIAN: Tami Ribas, MD   OPERATIVE REPORT   DATE OF PROCEDURE: 02/04/23    PREOPERATIVE DIAGNOSIS: Left recurrent carpal tunnel syndrome   POSTOPERATIVE DIAGNOSIS: Left recurrent carpal tunnel syndrome   PROCEDURE: Repeat left carpal tunnel release   SURGEON:  Betha Loa, M.D.   ASSISTANT: Cindee Salt, MD   ANESTHESIA:  General   INTRAVENOUS FLUIDS:  Per anesthesia flow sheet.   ESTIMATED BLOOD LOSS:  Minimal.   COMPLICATIONS:  None.   SPECIMENS:  none   TOURNIQUET TIME:    Total Tourniquet Time Documented: Upper Arm (Left) - 27 minutes Total: Upper Arm (Left) - 27 minutes    DISPOSITION:  Stable to PACU.   INDICATIONS: 57 year old female with numbness and tingling left hand as well as nocturnal symptoms.  Positive nerve conduction studies.  She had a carpal tunnel release approximate 20 years ago.  She wishes to proceed with repeat carpal tunnel release.  She had benefit after injection of the carpal tunnel.  Risks, benefits and alternatives of surgery were discussed including the risks of blood loss, infection, damage to nerves, vessels, tendons, ligaments, bone for surgery, need for additional surgery, complications with wound healing, continued pain, stiffness, , recurrence.  She voiced understanding of these risks and elected to proceed.  OPERATIVE COURSE:  After being identified preoperatively by myself,  the patient and I agreed on the procedure and site of the procedure.  The surgical site was marked.  Surgical consent had been signed. Preoperative IV antibiotic prophylaxis was given. She was transferred to the operating room and placed on the operating table in supine position with the Left upper extremity on an arm board.  General anesthesia was induced by the anesthesiologist.  Left upper extremity was prepped and draped in  normal sterile orthopedic fashion.  A surgical pause was performed between the surgeons, anesthesia, and operating room staff and all were in agreement as to the patient, procedure, and site of procedure.  Tourniquet at the proximal aspect of the extremity was inflated to 250 mmHg after exsanguination of the arm with an Esmarch bandage.  Incision was made over the carpal tunnel on the left hand and into the distal aspect of the forearm.  This was carried into subcutaneous tissues by spreading technique.  Bipolar electrocautery was used to obtain hemostasis.  The median nerve was identified deep to the volar antebrachial fascia.  It was then carefully freed up from the overlying reformed transverse carpal ligament.  It was carefully decompressed from proximal to distal under direct visualization while protecting the nerve.  The motor branch was identified and was intact.  The entirety of the nerve was decompressed.  There was some scar formation around the nerve.  This was carefully released and freed up from the nerve as well.  There was flattening and hyperemia of the nerve.  The wound was copiously irrigated with sterile saline.  Was closed with 4-0 nylon in a horizontal mattress fashion.  It was injected with quarter percent plain Marcaine to aid in postoperative analgesia.  It was then dressed with sterile Xeroform 4 x 4's and ABD and wrapped with Kerlix and Ace bandage.  The tourniquet was deflated at 27 minutes.  Fingertips were pink with brisk capillary refill after deflation of tourniquet.  The operative  drapes were broken down.  The patient was awoken from anesthesia safely.  She was  transferred back to the stretcher and taken to PACU in stable condition.  I will see her back in the office in 1 week for postoperative followup.  I will give her a prescription for Tramadol 50 mg 1-2 tabs PO q6 hours prn pain, dispense # 20.   Betha Loa, MD Electronically signed, 02/04/23

## 2023-02-04 NOTE — Discharge Instructions (Addendum)
[facility] Instructions Hand Surgery  Wound Care: Keep your hand elevated above the level of your heart.  Do not allow it to dangle by your side.  Keep the dressing dry and do not remove it unless your doctor advises you to do so.  He will usually change it at the time of your post-op visit.  Moving your fingers is advised to stimulate circulation but will depend on the site of your surgery.  If you have a splint applied, your doctor will advise you regarding movement.  Activity: Do not drive or operate machinery today.  Rest today and then you may return to your normal activity and work as indicated by your physician.  Diet:  Drink liquids today or eat a light diet.  You may resume a regular diet tomorrow.    General expectations: Pain for two to three days. Fingers may become slightly swollen.  Call your doctor if any of the following occur: Severe pain not relieved by pain medication. Elevated temperature. Dressing soaked with blood. Inability to move fingers. White or bluish color to fingers.    No Tylenol before 5:30pm if needed.  Post Anesthesia Home Care Instructions  Activity: Get plenty of rest for the remainder of the day. A responsible individual must stay with you for 24 hours following the procedure.  For the next 24 hours, DO NOT: -Drive a car -Advertising copywriter -Drink alcoholic beverages -Take any medication unless instructed by your physician -Make any legal decisions or sign important papers.  Meals: Start with liquid foods such as gelatin or soup. Progress to regular foods as tolerated. Avoid greasy, spicy, heavy foods. If nausea and/or vomiting occur, drink only clear liquids until the nausea and/or vomiting subsides. Call your physician if vomiting continues.  Special Instructions/Symptoms: Your throat may feel dry or sore from the anesthesia or the breathing tube placed in your throat during surgery. If this causes discomfort, gargle with warm salt  water. The discomfort should disappear within 24 hours.

## 2023-02-04 NOTE — Op Note (Signed)
I assisted Surgeons and Role:    * Betha Loa, MD - Primary    Cindee Salt, MD - Assisting on the Procedure(s): REPEAT CARPAL TUNNEL RELEASE LEFT on 02/04/2023.  I provided assistance on this case as follows: Set up , approach, identification of the nerve, neurolysis of the nerve, the wound and application of the dressing and splint.  Electronically signed by: Cindee Salt, MD Date: 02/04/2023 Time: 2:40 PM

## 2023-02-04 NOTE — Anesthesia Procedure Notes (Signed)
Procedure Name: LMA Insertion Date/Time: 02/04/2023 2:00 PM  Performed by: Garth Bigness, CRNAPre-anesthesia Checklist: Patient identified, Emergency Drugs available, Suction available and Patient being monitored Patient Re-evaluated:Patient Re-evaluated prior to induction Oxygen Delivery Method: Circle system utilized Preoxygenation: Pre-oxygenation with 100% oxygen Induction Type: IV induction Ventilation: Mask ventilation without difficulty LMA: LMA with gastric port inserted LMA Size: 5.0 Tube type: Oral Number of attempts: 2 Placement Confirmation: ETT inserted through vocal cords under direct vision, positive ETCO2 and breath sounds checked- equal and bilateral Tube secured with: Tape Dental Injury: Teeth and Oropharynx as per pre-operative assessment

## 2023-02-05 ENCOUNTER — Encounter (HOSPITAL_BASED_OUTPATIENT_CLINIC_OR_DEPARTMENT_OTHER): Payer: Self-pay | Admitting: Orthopedic Surgery

## 2023-02-05 NOTE — Anesthesia Postprocedure Evaluation (Signed)
Anesthesia Post Note  Patient: Audrey White  Procedure(s) Performed: REPEAT CARPAL TUNNEL RELEASE LEFT (Left: Hand)     Patient location during evaluation: PACU Anesthesia Type: General Level of consciousness: awake and alert Pain management: pain level controlled Vital Signs Assessment: post-procedure vital signs reviewed and stable Respiratory status: spontaneous breathing, nonlabored ventilation, respiratory function stable and patient connected to nasal cannula oxygen Cardiovascular status: blood pressure returned to baseline and stable Postop Assessment: no apparent nausea or vomiting Anesthetic complications: no   No notable events documented.  Last Vitals:  Vitals:   02/04/23 1515 02/04/23 1600  BP: 98/68 102/63  Pulse: 61 64  Resp: 15 14  Temp:  (!) 36.3 C  SpO2: 94% 94%    Last Pain:  Vitals:   02/05/23 0913  TempSrc:   PainSc: 9                  Jasreet Dickie S

## 2023-02-11 DIAGNOSIS — G5603 Carpal tunnel syndrome, bilateral upper limbs: Secondary | ICD-10-CM | POA: Diagnosis not present

## 2023-02-11 DIAGNOSIS — Z4889 Encounter for other specified surgical aftercare: Secondary | ICD-10-CM | POA: Diagnosis not present

## 2023-02-18 DIAGNOSIS — G5603 Carpal tunnel syndrome, bilateral upper limbs: Secondary | ICD-10-CM | POA: Diagnosis not present

## 2023-02-28 DIAGNOSIS — Z1231 Encounter for screening mammogram for malignant neoplasm of breast: Secondary | ICD-10-CM | POA: Diagnosis not present

## 2023-04-01 DIAGNOSIS — M65341 Trigger finger, right ring finger: Secondary | ICD-10-CM | POA: Diagnosis not present

## 2023-04-01 DIAGNOSIS — G5603 Carpal tunnel syndrome, bilateral upper limbs: Secondary | ICD-10-CM | POA: Diagnosis not present

## 2023-05-26 DIAGNOSIS — M25571 Pain in right ankle and joints of right foot: Secondary | ICD-10-CM | POA: Diagnosis not present

## 2023-05-26 DIAGNOSIS — M19071 Primary osteoarthritis, right ankle and foot: Secondary | ICD-10-CM | POA: Diagnosis not present

## 2023-06-16 DIAGNOSIS — M25579 Pain in unspecified ankle and joints of unspecified foot: Secondary | ICD-10-CM | POA: Diagnosis not present

## 2023-06-16 DIAGNOSIS — R32 Unspecified urinary incontinence: Secondary | ICD-10-CM | POA: Diagnosis not present

## 2023-06-16 DIAGNOSIS — Z6841 Body Mass Index (BMI) 40.0 and over, adult: Secondary | ICD-10-CM | POA: Diagnosis not present

## 2023-06-16 DIAGNOSIS — I10 Essential (primary) hypertension: Secondary | ICD-10-CM | POA: Diagnosis not present

## 2023-06-16 DIAGNOSIS — E119 Type 2 diabetes mellitus without complications: Secondary | ICD-10-CM | POA: Diagnosis not present

## 2023-06-16 DIAGNOSIS — J309 Allergic rhinitis, unspecified: Secondary | ICD-10-CM | POA: Diagnosis not present

## 2023-06-16 DIAGNOSIS — E039 Hypothyroidism, unspecified: Secondary | ICD-10-CM | POA: Diagnosis not present

## 2023-06-16 DIAGNOSIS — K219 Gastro-esophageal reflux disease without esophagitis: Secondary | ICD-10-CM | POA: Diagnosis not present

## 2023-06-16 DIAGNOSIS — F418 Other specified anxiety disorders: Secondary | ICD-10-CM | POA: Diagnosis not present

## 2023-06-27 DIAGNOSIS — S99911D Unspecified injury of right ankle, subsequent encounter: Secondary | ICD-10-CM | POA: Diagnosis not present

## 2023-06-27 DIAGNOSIS — M25571 Pain in right ankle and joints of right foot: Secondary | ICD-10-CM | POA: Diagnosis not present

## 2023-07-04 DIAGNOSIS — M25571 Pain in right ankle and joints of right foot: Secondary | ICD-10-CM | POA: Diagnosis not present

## 2023-07-04 DIAGNOSIS — M19071 Primary osteoarthritis, right ankle and foot: Secondary | ICD-10-CM | POA: Diagnosis not present

## 2023-07-04 DIAGNOSIS — S93401D Sprain of unspecified ligament of right ankle, subsequent encounter: Secondary | ICD-10-CM | POA: Diagnosis not present

## 2023-07-04 DIAGNOSIS — S99911D Unspecified injury of right ankle, subsequent encounter: Secondary | ICD-10-CM | POA: Diagnosis not present

## 2023-07-08 DIAGNOSIS — M19031 Primary osteoarthritis, right wrist: Secondary | ICD-10-CM | POA: Diagnosis not present

## 2023-07-16 DIAGNOSIS — M791 Myalgia, unspecified site: Secondary | ICD-10-CM | POA: Diagnosis not present

## 2023-07-16 DIAGNOSIS — R059 Cough, unspecified: Secondary | ICD-10-CM | POA: Diagnosis not present

## 2023-07-16 DIAGNOSIS — R0981 Nasal congestion: Secondary | ICD-10-CM | POA: Diagnosis not present

## 2023-07-16 DIAGNOSIS — H9201 Otalgia, right ear: Secondary | ICD-10-CM | POA: Diagnosis not present

## 2023-07-20 DIAGNOSIS — E871 Hypo-osmolality and hyponatremia: Secondary | ICD-10-CM | POA: Diagnosis not present

## 2023-07-20 DIAGNOSIS — Z1152 Encounter for screening for COVID-19: Secondary | ICD-10-CM | POA: Diagnosis not present

## 2023-07-20 DIAGNOSIS — J209 Acute bronchitis, unspecified: Secondary | ICD-10-CM | POA: Diagnosis not present

## 2023-07-20 DIAGNOSIS — R001 Bradycardia, unspecified: Secondary | ICD-10-CM | POA: Diagnosis not present

## 2023-07-20 DIAGNOSIS — M1990 Unspecified osteoarthritis, unspecified site: Secondary | ICD-10-CM | POA: Diagnosis not present

## 2023-07-20 DIAGNOSIS — F419 Anxiety disorder, unspecified: Secondary | ICD-10-CM | POA: Diagnosis not present

## 2023-07-20 DIAGNOSIS — R531 Weakness: Secondary | ICD-10-CM | POA: Diagnosis not present

## 2023-07-20 DIAGNOSIS — F32A Depression, unspecified: Secondary | ICD-10-CM | POA: Diagnosis not present

## 2023-07-20 DIAGNOSIS — E860 Dehydration: Secondary | ICD-10-CM | POA: Diagnosis not present

## 2023-07-20 DIAGNOSIS — D509 Iron deficiency anemia, unspecified: Secondary | ICD-10-CM | POA: Diagnosis not present

## 2023-07-20 DIAGNOSIS — M7711 Lateral epicondylitis, right elbow: Secondary | ICD-10-CM | POA: Diagnosis not present

## 2023-07-20 DIAGNOSIS — J45909 Unspecified asthma, uncomplicated: Secondary | ICD-10-CM | POA: Diagnosis not present

## 2023-07-20 DIAGNOSIS — R55 Syncope and collapse: Secondary | ICD-10-CM | POA: Diagnosis not present

## 2023-07-20 DIAGNOSIS — I10 Essential (primary) hypertension: Secondary | ICD-10-CM | POA: Diagnosis not present

## 2023-07-20 DIAGNOSIS — R109 Unspecified abdominal pain: Secondary | ICD-10-CM | POA: Diagnosis not present

## 2023-07-20 DIAGNOSIS — E039 Hypothyroidism, unspecified: Secondary | ICD-10-CM | POA: Diagnosis not present

## 2023-07-20 DIAGNOSIS — E7800 Pure hypercholesterolemia, unspecified: Secondary | ICD-10-CM | POA: Diagnosis not present

## 2023-07-20 DIAGNOSIS — Z79899 Other long term (current) drug therapy: Secondary | ICD-10-CM | POA: Diagnosis not present

## 2023-07-20 DIAGNOSIS — Z6841 Body Mass Index (BMI) 40.0 and over, adult: Secondary | ICD-10-CM | POA: Diagnosis not present

## 2023-07-20 DIAGNOSIS — Z791 Long term (current) use of non-steroidal anti-inflammatories (NSAID): Secondary | ICD-10-CM | POA: Diagnosis not present

## 2023-07-20 DIAGNOSIS — R42 Dizziness and giddiness: Secondary | ICD-10-CM | POA: Diagnosis not present

## 2023-07-20 DIAGNOSIS — D72829 Elevated white blood cell count, unspecified: Secondary | ICD-10-CM | POA: Diagnosis not present

## 2023-07-21 DIAGNOSIS — R001 Bradycardia, unspecified: Secondary | ICD-10-CM | POA: Diagnosis not present

## 2023-07-21 DIAGNOSIS — E860 Dehydration: Secondary | ICD-10-CM | POA: Diagnosis not present

## 2023-07-21 DIAGNOSIS — E871 Hypo-osmolality and hyponatremia: Secondary | ICD-10-CM | POA: Diagnosis not present

## 2023-07-21 DIAGNOSIS — R55 Syncope and collapse: Secondary | ICD-10-CM | POA: Diagnosis not present

## 2023-07-22 DIAGNOSIS — R001 Bradycardia, unspecified: Secondary | ICD-10-CM | POA: Diagnosis not present

## 2023-07-22 DIAGNOSIS — R42 Dizziness and giddiness: Secondary | ICD-10-CM | POA: Diagnosis not present

## 2023-07-22 DIAGNOSIS — I10 Essential (primary) hypertension: Secondary | ICD-10-CM | POA: Diagnosis not present

## 2023-07-23 DIAGNOSIS — E860 Dehydration: Secondary | ICD-10-CM | POA: Diagnosis not present

## 2023-07-23 DIAGNOSIS — M7710 Lateral epicondylitis, unspecified elbow: Secondary | ICD-10-CM | POA: Diagnosis not present

## 2023-07-23 DIAGNOSIS — R001 Bradycardia, unspecified: Secondary | ICD-10-CM | POA: Diagnosis not present

## 2023-07-23 DIAGNOSIS — E871 Hypo-osmolality and hyponatremia: Secondary | ICD-10-CM | POA: Diagnosis not present

## 2023-07-24 DIAGNOSIS — R29898 Other symptoms and signs involving the musculoskeletal system: Secondary | ICD-10-CM | POA: Diagnosis not present

## 2023-07-24 DIAGNOSIS — R059 Cough, unspecified: Secondary | ICD-10-CM | POA: Diagnosis not present

## 2023-07-25 DIAGNOSIS — I10 Essential (primary) hypertension: Secondary | ICD-10-CM | POA: Diagnosis not present

## 2023-07-25 DIAGNOSIS — M25521 Pain in right elbow: Secondary | ICD-10-CM | POA: Diagnosis not present

## 2023-07-25 DIAGNOSIS — E7800 Pure hypercholesterolemia, unspecified: Secondary | ICD-10-CM | POA: Diagnosis not present

## 2023-07-25 DIAGNOSIS — M7711 Lateral epicondylitis, right elbow: Secondary | ICD-10-CM | POA: Diagnosis not present

## 2023-07-25 DIAGNOSIS — R279 Unspecified lack of coordination: Secondary | ICD-10-CM | POA: Diagnosis not present

## 2023-07-25 DIAGNOSIS — R410 Disorientation, unspecified: Secondary | ICD-10-CM | POA: Diagnosis not present

## 2023-07-25 DIAGNOSIS — F32A Depression, unspecified: Secondary | ICD-10-CM | POA: Diagnosis not present

## 2023-07-25 DIAGNOSIS — R531 Weakness: Secondary | ICD-10-CM | POA: Diagnosis not present

## 2023-07-25 DIAGNOSIS — R001 Bradycardia, unspecified: Secondary | ICD-10-CM | POA: Diagnosis not present

## 2023-07-25 DIAGNOSIS — J45909 Unspecified asthma, uncomplicated: Secondary | ICD-10-CM | POA: Diagnosis not present

## 2023-07-25 DIAGNOSIS — R2689 Other abnormalities of gait and mobility: Secondary | ICD-10-CM | POA: Diagnosis not present

## 2023-07-25 DIAGNOSIS — Z743 Need for continuous supervision: Secondary | ICD-10-CM | POA: Diagnosis not present

## 2023-07-25 DIAGNOSIS — F419 Anxiety disorder, unspecified: Secondary | ICD-10-CM | POA: Diagnosis not present

## 2023-07-25 DIAGNOSIS — M6281 Muscle weakness (generalized): Secondary | ICD-10-CM | POA: Diagnosis not present

## 2023-07-25 DIAGNOSIS — M1380 Other specified arthritis, unspecified site: Secondary | ICD-10-CM | POA: Diagnosis not present

## 2023-07-25 DIAGNOSIS — E039 Hypothyroidism, unspecified: Secondary | ICD-10-CM | POA: Diagnosis not present

## 2023-07-25 DIAGNOSIS — Z6841 Body Mass Index (BMI) 40.0 and over, adult: Secondary | ICD-10-CM | POA: Diagnosis not present

## 2023-08-19 DIAGNOSIS — Z1331 Encounter for screening for depression: Secondary | ICD-10-CM | POA: Diagnosis not present

## 2023-08-19 DIAGNOSIS — M25571 Pain in right ankle and joints of right foot: Secondary | ICD-10-CM | POA: Diagnosis not present

## 2023-08-19 DIAGNOSIS — Z6841 Body Mass Index (BMI) 40.0 and over, adult: Secondary | ICD-10-CM | POA: Diagnosis not present

## 2023-08-19 DIAGNOSIS — R531 Weakness: Secondary | ICD-10-CM | POA: Diagnosis not present

## 2023-08-19 DIAGNOSIS — Z1339 Encounter for screening examination for other mental health and behavioral disorders: Secondary | ICD-10-CM | POA: Diagnosis not present

## 2023-08-19 DIAGNOSIS — Z0000 Encounter for general adult medical examination without abnormal findings: Secondary | ICD-10-CM | POA: Diagnosis not present

## 2023-08-29 DIAGNOSIS — M7751 Other enthesopathy of right foot: Secondary | ICD-10-CM | POA: Diagnosis not present

## 2023-10-09 DIAGNOSIS — M19171 Post-traumatic osteoarthritis, right ankle and foot: Secondary | ICD-10-CM | POA: Diagnosis not present

## 2023-10-20 ENCOUNTER — Other Ambulatory Visit: Payer: Self-pay | Admitting: Orthopedic Surgery

## 2023-11-13 DIAGNOSIS — M19071 Primary osteoarthritis, right ankle and foot: Secondary | ICD-10-CM | POA: Diagnosis not present

## 2023-12-02 DIAGNOSIS — H524 Presbyopia: Secondary | ICD-10-CM | POA: Diagnosis not present

## 2023-12-16 ENCOUNTER — Other Ambulatory Visit: Payer: Self-pay | Admitting: Orthopedic Surgery

## 2023-12-16 DIAGNOSIS — M25571 Pain in right ankle and joints of right foot: Secondary | ICD-10-CM | POA: Diagnosis not present

## 2023-12-16 DIAGNOSIS — M19071 Primary osteoarthritis, right ankle and foot: Secondary | ICD-10-CM

## 2023-12-18 ENCOUNTER — Inpatient Hospital Stay (HOSPITAL_COMMUNITY): Admit: 2023-12-18 | Admitting: Orthopedic Surgery

## 2023-12-18 DIAGNOSIS — M19071 Primary osteoarthritis, right ankle and foot: Secondary | ICD-10-CM | POA: Diagnosis not present

## 2023-12-18 SURGERY — ARTHRODESIS ANKLE
Anesthesia: General | Site: Ankle | Laterality: Right

## 2023-12-29 DIAGNOSIS — M25571 Pain in right ankle and joints of right foot: Secondary | ICD-10-CM | POA: Diagnosis not present

## 2023-12-29 DIAGNOSIS — E162 Hypoglycemia, unspecified: Secondary | ICD-10-CM | POA: Diagnosis not present

## 2023-12-29 DIAGNOSIS — E039 Hypothyroidism, unspecified: Secondary | ICD-10-CM | POA: Diagnosis not present

## 2023-12-29 DIAGNOSIS — Z6839 Body mass index (BMI) 39.0-39.9, adult: Secondary | ICD-10-CM | POA: Diagnosis not present
